# Patient Record
Sex: Male | Born: 1935 | Race: White | Hispanic: No | Smoking: Former smoker
Health system: Southern US, Community
[De-identification: ages and names within clinical notes are randomized; demographics above are authoritative.]

## PROBLEM LIST (undated history)

## (undated) DIAGNOSIS — Z86711 Personal history of pulmonary embolism: Secondary | ICD-10-CM

## (undated) DIAGNOSIS — I10 Essential (primary) hypertension: Secondary | ICD-10-CM

## (undated) DIAGNOSIS — I82419 Acute embolism and thrombosis of unspecified femoral vein: Secondary | ICD-10-CM

## (undated) DIAGNOSIS — E119 Type 2 diabetes mellitus without complications: Secondary | ICD-10-CM

## (undated) DIAGNOSIS — Z85820 Personal history of malignant melanoma of skin: Secondary | ICD-10-CM

## (undated) DIAGNOSIS — I219 Acute myocardial infarction, unspecified: Secondary | ICD-10-CM

## (undated) DIAGNOSIS — I509 Heart failure, unspecified: Secondary | ICD-10-CM

## (undated) DIAGNOSIS — E039 Hypothyroidism, unspecified: Secondary | ICD-10-CM

## (undated) DIAGNOSIS — I429 Cardiomyopathy, unspecified: Secondary | ICD-10-CM

## (undated) DIAGNOSIS — I879 Disorder of vein, unspecified: Secondary | ICD-10-CM

## (undated) DIAGNOSIS — I2510 Atherosclerotic heart disease of native coronary artery without angina pectoris: Secondary | ICD-10-CM

## (undated) DIAGNOSIS — G935 Compression of brain: Secondary | ICD-10-CM

## (undated) DIAGNOSIS — K219 Gastro-esophageal reflux disease without esophagitis: Secondary | ICD-10-CM

## (undated) DIAGNOSIS — E785 Hyperlipidemia, unspecified: Secondary | ICD-10-CM

## (undated) DIAGNOSIS — I519 Heart disease, unspecified: Secondary | ICD-10-CM

## (undated) DIAGNOSIS — L719 Rosacea, unspecified: Secondary | ICD-10-CM

## (undated) DIAGNOSIS — C801 Malignant (primary) neoplasm, unspecified: Secondary | ICD-10-CM

## (undated) DIAGNOSIS — L219 Seborrheic dermatitis, unspecified: Secondary | ICD-10-CM

## (undated) HISTORY — DX: Personal history of pulmonary embolism: Z86.711

## (undated) HISTORY — PX: APPENDECTOMY: SHX54

## (undated) HISTORY — DX: Atherosclerotic heart disease of native coronary artery without angina pectoris: I25.10

## (undated) HISTORY — DX: Compression of brain: G93.5

## (undated) HISTORY — PX: DIAPHRAGMATIC HERNIA REPAIR: SUR1167

## (undated) HISTORY — DX: Acute myocardial infarction, unspecified: I21.9

## (undated) HISTORY — DX: Disorder of vein, unspecified: I87.9

## (undated) HISTORY — DX: Seborrheic dermatitis, unspecified: L21.9

## (undated) HISTORY — PX: ANGIOPLASTY / STENTING FEMORAL: SUR30

## (undated) HISTORY — PX: HERNIA REPAIR: SHX51

## (undated) HISTORY — DX: Hyperlipidemia, unspecified: E78.5

## (undated) HISTORY — DX: Acute embolism and thrombosis of unspecified femoral vein: I82.419

## (undated) HISTORY — PX: CATARACT EXTRACTION: SUR2

## (undated) HISTORY — DX: Heart disease, unspecified: I51.9

## (undated) HISTORY — DX: Essential (primary) hypertension: I10

## (undated) HISTORY — DX: Rosacea, unspecified: L71.9

## (undated) HISTORY — DX: Personal history of malignant melanoma of skin: Z85.820

## (undated) HISTORY — DX: Cardiomyopathy, unspecified: I42.9

## (undated) HISTORY — PX: OTHER SURGICAL HISTORY: SHX169

## (undated) HISTORY — DX: Type 2 diabetes mellitus without complications: E11.9

## (undated) HISTORY — PX: SKIN BIOPSY: SHX1

---

## 1986-04-04 DIAGNOSIS — C801 Malignant (primary) neoplasm, unspecified: Secondary | ICD-10-CM

## 1986-04-04 HISTORY — DX: Malignant (primary) neoplasm, unspecified: C80.1

## 2003-06-22 ENCOUNTER — Other Ambulatory Visit: Payer: Self-pay

## 2004-01-16 ENCOUNTER — Ambulatory Visit: Payer: Self-pay | Admitting: Internal Medicine

## 2005-04-04 DIAGNOSIS — I219 Acute myocardial infarction, unspecified: Secondary | ICD-10-CM

## 2005-04-04 HISTORY — PX: CORONARY ANGIOPLASTY: SHX604

## 2005-04-04 HISTORY — PX: CARDIAC SURGERY: SHX584

## 2005-04-04 HISTORY — DX: Acute myocardial infarction, unspecified: I21.9

## 2005-04-04 HISTORY — PX: CARDIAC CATHETERIZATION: SHX172

## 2005-08-25 ENCOUNTER — Other Ambulatory Visit: Payer: Self-pay

## 2005-08-25 ENCOUNTER — Inpatient Hospital Stay: Payer: Self-pay | Admitting: Internal Medicine

## 2005-11-03 ENCOUNTER — Ambulatory Visit: Payer: Self-pay | Admitting: Gastroenterology

## 2007-06-03 ENCOUNTER — Other Ambulatory Visit: Payer: Self-pay

## 2007-06-03 ENCOUNTER — Emergency Department: Payer: Self-pay | Admitting: Emergency Medicine

## 2007-06-22 ENCOUNTER — Ambulatory Visit: Payer: Self-pay | Admitting: Internal Medicine

## 2007-07-04 ENCOUNTER — Ambulatory Visit: Payer: Self-pay | Admitting: Internal Medicine

## 2007-08-09 ENCOUNTER — Ambulatory Visit: Payer: Self-pay | Admitting: Internal Medicine

## 2007-09-03 ENCOUNTER — Ambulatory Visit: Payer: Self-pay | Admitting: Internal Medicine

## 2008-04-16 ENCOUNTER — Ambulatory Visit: Payer: Self-pay | Admitting: Sports Medicine

## 2008-06-23 ENCOUNTER — Ambulatory Visit: Payer: Self-pay | Admitting: Sports Medicine

## 2009-03-16 ENCOUNTER — Ambulatory Visit: Payer: Self-pay | Admitting: Gastroenterology

## 2009-06-02 HISTORY — PX: ORIF GREATER TROCHANTERIC FRACTURE: SUR934

## 2009-06-23 ENCOUNTER — Inpatient Hospital Stay: Payer: Self-pay | Admitting: Specialist

## 2009-06-30 ENCOUNTER — Encounter: Payer: Self-pay | Admitting: Internal Medicine

## 2009-07-03 ENCOUNTER — Encounter: Payer: Self-pay | Admitting: Internal Medicine

## 2009-08-02 ENCOUNTER — Encounter: Payer: Self-pay | Admitting: Internal Medicine

## 2009-09-02 ENCOUNTER — Encounter: Payer: Self-pay | Admitting: Internal Medicine

## 2009-10-19 ENCOUNTER — Encounter: Payer: Self-pay | Admitting: Specialist

## 2009-10-27 ENCOUNTER — Encounter: Payer: Self-pay | Admitting: Internal Medicine

## 2009-11-02 ENCOUNTER — Encounter: Payer: Self-pay | Admitting: Specialist

## 2009-11-19 ENCOUNTER — Encounter: Payer: Self-pay | Admitting: Internal Medicine

## 2009-12-03 ENCOUNTER — Encounter: Payer: Self-pay | Admitting: Specialist

## 2009-12-22 ENCOUNTER — Encounter: Payer: Self-pay | Admitting: Internal Medicine

## 2010-01-02 ENCOUNTER — Encounter: Payer: Self-pay | Admitting: Specialist

## 2010-01-02 ENCOUNTER — Encounter: Payer: Self-pay | Admitting: Internal Medicine

## 2010-01-19 ENCOUNTER — Ambulatory Visit: Payer: Self-pay | Admitting: Internal Medicine

## 2010-02-02 ENCOUNTER — Encounter: Payer: Self-pay | Admitting: Specialist

## 2010-02-12 ENCOUNTER — Emergency Department: Payer: Self-pay | Admitting: Emergency Medicine

## 2010-03-11 ENCOUNTER — Ambulatory Visit: Payer: Self-pay

## 2010-06-15 ENCOUNTER — Ambulatory Visit: Payer: Self-pay | Admitting: Internal Medicine

## 2010-06-22 ENCOUNTER — Inpatient Hospital Stay: Payer: Self-pay | Admitting: Internal Medicine

## 2010-12-29 ENCOUNTER — Other Ambulatory Visit: Payer: Self-pay | Admitting: Internal Medicine

## 2010-12-30 ENCOUNTER — Ambulatory Visit: Payer: Self-pay | Admitting: Specialist

## 2011-07-07 ENCOUNTER — Ambulatory Visit: Payer: Self-pay | Admitting: Specialist

## 2012-03-13 ENCOUNTER — Ambulatory Visit: Payer: Self-pay | Admitting: Specialist

## 2013-09-25 ENCOUNTER — Ambulatory Visit: Payer: Self-pay | Admitting: Internal Medicine

## 2013-10-02 ENCOUNTER — Ambulatory Visit
Admit: 2013-10-02 | Disposition: A | Discharge status: Home / Self Care | Payer: Self-pay | Attending: Nurse Practitioner | Admitting: Nurse Practitioner

## 2013-10-02 ENCOUNTER — Ambulatory Visit: Payer: Self-pay | Admitting: Internal Medicine

## 2013-10-07 ENCOUNTER — Observation Stay: Payer: Self-pay | Admitting: Surgery

## 2013-10-07 LAB — COMPREHENSIVE METABOLIC PANEL
ALBUMIN: 3.3 g/dL — AB (ref 3.4–5.0)
ALK PHOS: 38 U/L — AB
Anion Gap: 5 — ABNORMAL LOW (ref 7–16)
BUN: 12 mg/dL (ref 7–18)
Bilirubin,Total: 1 mg/dL (ref 0.2–1.0)
CALCIUM: 8.5 mg/dL (ref 8.5–10.1)
Chloride: 101 mmol/L (ref 98–107)
Co2: 28 mmol/L (ref 21–32)
Creatinine: 0.82 mg/dL (ref 0.60–1.30)
EGFR (African American): >60
EGFR (Non-African Amer.): >60
GLUCOSE: 165 mg/dL — AB (ref 65–99)
Osmolality: 272 (ref 275–301)
Potassium: 4.5 mmol/L (ref 3.5–5.1)
SGOT(AST): 25 U/L (ref 15–37)
SGPT (ALT): 26 U/L (ref 12–78)
SODIUM: 134 mmol/L — AB (ref 136–145)
Total Protein: 7.4 g/dL (ref 6.4–8.2)

## 2013-10-07 LAB — CBC WITH DIFFERENTIAL/PLATELET
BASOS PCT: 0.4 %
Basophil #: 0 10*3/uL (ref 0.0–0.1)
Eosinophil #: 0.1 10*3/uL (ref 0.0–0.7)
Eosinophil %: 1.1 %
HCT: 46.1 % (ref 40.0–52.0)
HGB: 15.5 g/dL (ref 13.0–18.0)
Lymphocyte #: 1.2 10*3/uL (ref 1.0–3.6)
Lymphocyte %: 14.7 %
MCH: 34 pg (ref 26.0–34.0)
MCHC: 33.6 g/dL (ref 32.0–36.0)
MCV: 101 fL — AB (ref 80–100)
MONO ABS: 0.5 x10 3/mm (ref 0.2–1.0)
MONOS PCT: 6.5 %
NEUTROS PCT: 77.3 %
Neutrophil #: 6.5 10*3/uL (ref 1.4–6.5)
Platelet: 201 10*3/uL (ref 150–440)
RBC: 4.57 10*6/uL (ref 4.40–5.90)
RDW: 13.4 % (ref 11.5–14.5)
WBC: 8.4 10*3/uL (ref 3.8–10.6)

## 2013-10-07 LAB — LIPASE, BLOOD: Lipase: 95 U/L (ref 73–393)

## 2013-10-08 ENCOUNTER — Encounter: Payer: Self-pay | Admitting: Internal Medicine

## 2013-10-09 LAB — URINALYSIS, COMPLETE
Bacteria: NONE SEEN
Bilirubin,UR: NEGATIVE
Blood: NEGATIVE
Glucose,UR: 50 mg/dL (ref 0–75)
Leukocyte Esterase: NEGATIVE
NITRITE: NEGATIVE
Ph: 6 (ref 4.5–8.0)
Protein: 30
RBC,UR: <1 /HPF (ref 0–5)
Specific Gravity: 1.02 (ref 1.003–1.030)

## 2013-10-10 LAB — URINE CULTURE

## 2013-10-10 LAB — PATHOLOGY REPORT

## 2013-10-17 LAB — COMPREHENSIVE METABOLIC PANEL
ALBUMIN: 2.4 g/dL — AB (ref 3.4–5.0)
Alkaline Phosphatase: 60 U/L
Anion Gap: 7 (ref 7–16)
BUN: 12 mg/dL (ref 7–18)
Bilirubin,Total: 0.8 mg/dL (ref 0.2–1.0)
CALCIUM: 8 mg/dL — AB (ref 8.5–10.1)
CREATININE: 0.9 mg/dL (ref 0.60–1.30)
Chloride: 99 mmol/L (ref 98–107)
Co2: 25 mmol/L (ref 21–32)
EGFR (African American): >60
EGFR (Non-African Amer.): >60
Glucose: 165 mg/dL — ABNORMAL HIGH (ref 65–99)
Osmolality: 266 (ref 275–301)
POTASSIUM: 3.5 mmol/L (ref 3.5–5.1)
SGOT(AST): 26 U/L (ref 15–37)
SGPT (ALT): 39 U/L (ref 12–78)
SODIUM: 131 mmol/L — AB (ref 136–145)
Total Protein: 6.6 g/dL (ref 6.4–8.2)

## 2013-10-17 LAB — CBC WITH DIFFERENTIAL/PLATELET
BASOS PCT: 0.8 %
Basophil #: 0 10*3/uL (ref 0.0–0.1)
EOS ABS: 0.4 10*3/uL (ref 0.0–0.7)
Eosinophil %: 6.3 %
HCT: 40.2 % (ref 40.0–52.0)
HGB: 13.2 g/dL (ref 13.0–18.0)
Lymphocyte #: 1.2 10*3/uL (ref 1.0–3.6)
Lymphocyte %: 19 %
MCH: 33 pg (ref 26.0–34.0)
MCHC: 32.9 g/dL (ref 32.0–36.0)
MCV: 100 fL (ref 80–100)
MONOS PCT: 11 %
Monocyte #: 0.7 x10 3/mm (ref 0.2–1.0)
Neutrophil #: 4.1 10*3/uL (ref 1.4–6.5)
Neutrophil %: 62.9 %
Platelet: 226 10*3/uL (ref 150–440)
RBC: 4.02 10*6/uL — AB (ref 4.40–5.90)
RDW: 12.5 % (ref 11.5–14.5)
WBC: 6.5 10*3/uL (ref 3.8–10.6)

## 2013-10-17 LAB — MAGNESIUM: Magnesium: 1.8 mg/dL

## 2013-10-17 LAB — TSH: Thyroid Stimulating Horm: 0.043 u[IU]/mL — ABNORMAL LOW

## 2013-11-02 ENCOUNTER — Encounter: Payer: Self-pay | Admitting: Internal Medicine

## 2013-11-28 LAB — TSH: THYROID STIMULATING HORM: 5.34 u[IU]/mL — AB

## 2013-12-03 ENCOUNTER — Encounter: Payer: Self-pay | Admitting: Internal Medicine

## 2014-01-02 ENCOUNTER — Encounter: Payer: Self-pay | Admitting: Internal Medicine

## 2014-01-28 LAB — HEMOGLOBIN A1C: HEMOGLOBIN A1C: 8.2 % — AB (ref 4.2–6.3)

## 2014-01-28 LAB — BASIC METABOLIC PANEL
ANION GAP: 10 (ref 7–16)
BUN: 12 mg/dL (ref 7–18)
Calcium, Total: 8.6 mg/dL (ref 8.5–10.1)
Chloride: 103 mmol/L (ref 98–107)
Co2: 27 mmol/L (ref 21–32)
Creatinine: 0.77 mg/dL (ref 0.60–1.30)
GLUCOSE: 195 mg/dL — AB (ref 65–99)
OSMOLALITY: 285 (ref 275–301)
POTASSIUM: 3.8 mmol/L (ref 3.5–5.1)
SODIUM: 140 mmol/L (ref 136–145)

## 2014-01-28 LAB — TSH: THYROID STIMULATING HORM: 29.8 u[IU]/mL — AB

## 2014-02-02 ENCOUNTER — Encounter: Payer: Self-pay | Admitting: Internal Medicine

## 2014-03-02 ENCOUNTER — Inpatient Hospital Stay: Payer: Self-pay | Admitting: Internal Medicine

## 2014-03-02 LAB — BASIC METABOLIC PANEL
ANION GAP: 7 (ref 7–16)
BUN: 13 mg/dL (ref 7–18)
Calcium, Total: 8.3 mg/dL — ABNORMAL LOW (ref 8.5–10.1)
Chloride: 104 mmol/L (ref 98–107)
Co2: 26 mmol/L (ref 21–32)
Creatinine: 0.93 mg/dL (ref 0.60–1.30)
EGFR (African American): >60
Glucose: 265 mg/dL — ABNORMAL HIGH (ref 65–99)
Osmolality: 283 (ref 275–301)
Potassium: 3.8 mmol/L (ref 3.5–5.1)
Sodium: 137 mmol/L (ref 136–145)

## 2014-03-02 LAB — CBC
HCT: 41.8 % (ref 40.0–52.0)
HGB: 13.8 g/dL (ref 13.0–18.0)
MCH: 32.9 pg (ref 26.0–34.0)
MCHC: 33.1 g/dL (ref 32.0–36.0)
MCV: 99 fL (ref 80–100)
Platelet: 197 10*3/uL (ref 150–440)
RBC: 4.21 10*6/uL — ABNORMAL LOW (ref 4.40–5.90)
RDW: 13.4 % (ref 11.5–14.5)
WBC: 8.5 10*3/uL (ref 3.8–10.6)

## 2014-03-02 LAB — TROPONIN I
TROPONIN-I: 0.04 ng/mL
Troponin-I: 0.03 ng/mL

## 2014-03-02 LAB — PROTIME-INR
INR: 1.1
PROTHROMBIN TIME: 13.6 s (ref 11.5–14.7)

## 2014-03-02 LAB — PRO B NATRIURETIC PEPTIDE: B-Type Natriuretic Peptide: 3223 pg/mL — ABNORMAL HIGH (ref 0–450)

## 2014-03-03 LAB — BASIC METABOLIC PANEL
Anion Gap: 8 (ref 7–16)
BUN: 15 mg/dL (ref 7–18)
CALCIUM: 8.1 mg/dL — AB (ref 8.5–10.1)
CREATININE: 0.9 mg/dL (ref 0.60–1.30)
Chloride: 103 mmol/L (ref 98–107)
Co2: 27 mmol/L (ref 21–32)
EGFR (Non-African Amer.): >60
Glucose: 183 mg/dL — ABNORMAL HIGH (ref 65–99)
Osmolality: 281 (ref 275–301)
Potassium: 3.5 mmol/L (ref 3.5–5.1)
Sodium: 138 mmol/L (ref 136–145)

## 2014-03-04 ENCOUNTER — Ambulatory Visit
Admit: 2014-03-04 | Disposition: A | Discharge status: Home / Self Care | Payer: Self-pay | Attending: Nurse Practitioner | Admitting: Nurse Practitioner

## 2014-03-04 ENCOUNTER — Encounter: Payer: Self-pay | Admitting: Internal Medicine

## 2014-03-04 LAB — CBC WITH DIFFERENTIAL/PLATELET
Basophil #: 0 10*3/uL (ref 0.0–0.1)
Basophil %: 0.9 %
EOS PCT: 5.2 %
Eosinophil #: 0.3 10*3/uL (ref 0.0–0.7)
HCT: 39.4 % — ABNORMAL LOW (ref 40.0–52.0)
HGB: 13.1 g/dL (ref 13.0–18.0)
Lymphocyte #: 1.5 10*3/uL (ref 1.0–3.6)
Lymphocyte %: 27.9 %
MCH: 32.7 pg (ref 26.0–34.0)
MCHC: 33.3 g/dL (ref 32.0–36.0)
MCV: 98 fL (ref 80–100)
Monocyte #: 0.7 x10 3/mm (ref 0.2–1.0)
Monocyte %: 13.1 %
NEUTROS ABS: 2.8 10*3/uL (ref 1.4–6.5)
Neutrophil %: 52.9 %
Platelet: 191 10*3/uL (ref 150–440)
RBC: 4.01 10*6/uL — ABNORMAL LOW (ref 4.40–5.90)
RDW: 13.5 % (ref 11.5–14.5)
WBC: 5.4 10*3/uL (ref 3.8–10.6)

## 2014-03-04 LAB — BASIC METABOLIC PANEL
Anion Gap: 8 (ref 7–16)
BUN: 16 mg/dL (ref 7–18)
CO2: 26 mmol/L (ref 21–32)
Calcium, Total: 8.3 mg/dL — ABNORMAL LOW (ref 8.5–10.1)
Chloride: 104 mmol/L (ref 98–107)
Creatinine: 0.85 mg/dL (ref 0.60–1.30)
EGFR (African American): >60
GLUCOSE: 184 mg/dL — AB (ref 65–99)
OSMOLALITY: 282 (ref 275–301)
Potassium: 3.7 mmol/L (ref 3.5–5.1)
Sodium: 138 mmol/L (ref 136–145)

## 2014-04-04 ENCOUNTER — Encounter: Payer: Self-pay | Admitting: Internal Medicine

## 2014-05-05 ENCOUNTER — Encounter: Payer: Self-pay | Admitting: Internal Medicine

## 2014-06-03 ENCOUNTER — Encounter
Admit: 2014-06-03 | Disposition: A | Discharge status: Home / Self Care | Payer: Self-pay | Attending: Internal Medicine | Admitting: Internal Medicine

## 2014-07-04 ENCOUNTER — Encounter
Admit: 2014-07-04 | Disposition: A | Discharge status: Home / Self Care | Payer: Self-pay | Attending: Internal Medicine | Admitting: Internal Medicine

## 2014-07-26 NOTE — Op Note (Signed)
PATIENT NAME:  Ruben Gallegos, Ruben Gallegos MR#:  [id number] DATE OF BIRTH:  01-Dec-1920  DATE OF PROCEDURE:  10/07/2013  OPERATION PERFORMED: Repair of incarcerated right inguinal hernia with mesh Karl Pock).   PREOPERATIVE DIAGNOSIS: Incarcerated right inguinal hernia.   POSTOPERATIVE DIAGNOSIS: Incarcerated right inguinal hernia (direct).   FINDINGS: Venous congestion of small bowel, all viable.   PROCEDURE IN DETAIL: The patient was placed supine on the Operating Room table and prepped and draped in the usual sterile fashion. An incision was made in the lines of the skin overlying the right inguinal canal, and this was carried down through the subcutaneous tissue and Scarpa's fascia with the electrocautery. The external oblique aponeurosis was opened in the direction of its fibers to the external inguinal ring, and the surrounding tissue around the hernia sac was dissected off of the top of the hernia sac and then, ultimately, the hernia sac was entered but not real high in the inguinal canal.   There were multiple loops of small intestine, with some localized ascites present. The loops of small bowel demonstrated venous congestion, and there was one area where the small intestine was coming through the ring and looked initially to be a little bit narrowed, but with further manipulation, it was quite viable, with no evidence of stricture formation. The loops of small intestine were dunked back within the peritoneal cavity, and the remainder of the very large direct hernia sac was dissected free of surrounding structures to an area very high on the inguinal floor. Here, it was closed with a pursestring suture of 3-0 Monocryl and the excess hernia sac was amputated.   This left the inguinal floor essentially flat. There was a large weakness in the inguinal floor, but the actual defect was quite small, being approximately 15 mm in diameter. The spermatic cord was plastered to the lateral wall of the inguinal  canal, and it was dissected off of the lateral edge of the external oblique aponeurosis and Poupart's ligament, such that it was completely freed up along the entire inguinal canal and it was also dissected off of the pubic tubercle inferiorly. Dissection underneath the external oblique aponeurosis was performed superolaterally to the cord.   A large polypropylene precut keyhole mesh by Davol was used to perform a Lichtenstein, tension-free type repair with two running suture lines. The lateral suture line began at the pubic tubercle and went along the shelving edge of Poupart's ligament, up to the level of the new deep ring (keyhole), and the medial suture line went from the pubic tubercle across the conjoint tendon to the internal oblique musculature, and was carried up to the new deep ring as well.   The tails of the mesh lay flat and crossed superolateral to the new deep ring and underneath the external oblique aponeurosis, and the toe of the mesh overlapped the pubic tubercle itself. The spermatic cord was replaced in its anatomic position, and the external oblique aponeurosis was closed, reconstructing the external inguinal ring in a fashion that was more inferior than it had been, due to the large hernia sac, and this was done with a running suture line of 2-0 Prolene as well. Interrupted 3-0 Monocryls were placed in Scarpa's fascia, and the skin was reapproximated with an Insorb stapling device and a 1 inch suture strip. The patient tolerated the procedure well. There were no complications.     ____________________________ Consuela Mimes, MD wfm:cg D: 10/07/2013 22:10:40 ET T: 10/08/2013 06:25:25 ET JOB#: [id number]  cc: Gwyndolyn Saxon  Margy Clarks, MD, <Dictator> [name], MD Consuela Mimes MD ELECTRONICALLY SIGNED 10/09/2013 19:37

## 2014-07-26 NOTE — Consult Note (Signed)
PATIENT NAME:  Ruben Gallegos, Ruben Gallegos MR#:  [id number] DATE OF BIRTH:  [birth year]  DATE OF CONSULTATION:  03/02/2014  CONSULTING PHYSICIAN:  Isaias Cowman, MD  PRIMARY CARE PHYSICIAN: [name], MD   CARDIOLOGIST: Corey Skains, MD   CHIEF COMPLAINT: Shortness of breath.   REASON FOR CONSULTATION: Consultation requested for evaluation of congestive heart failure.   HISTORY OF PRESENT ILLNESS: The patient is a 81 year old gentleman with a prior history of DVT, coronary artery disease status post coronary stent and chronic congestive heart failure. The patient apparently had been in his usual state of health until approximately 2 months ago following a hernia repair, started to have increased appetite but also notable worsening peripheral edema and shortness of breath. He presented to [facility] Emergency Room yesterday with shortness of breath. Chest x-ray revealed pulmonary congestion and the patient was treated with furosemide 40 mg IV with diuresis and overall clinical improvement. Lower extremity ultrasound was performed, which revealed right lower extremity DVT, which was occlusive. A CT scan was performed, which revealed evidence for small pulmonary emboli. Admission labs were notable for a negative troponin of 0.03.   PAST MEDICAL HISTORY: 1. Status post coronary stent in 2006.  2. Congestive heart failure.  3. History of left lower extremity DVT following hip surgery in 2001.  4. Hyperlipidemia.  5. Hypothyroidism.  6. History of melanoma.   MEDICATIONS: Enteric aspirin 325 mg daily, carvedilol 3.125 mg b.i.d., furosemide 40 mg p.o. daily, Lescol 40 mg daily, potassium 10 mEq daily, cholecalciferol 1000 international units 1 capsule daily, glipizide 10 mg b.i.d., Lantus 14 units daily, mirtazapine 7.5 mg 3 tabs at bedtime, Nexium 40 mg daily, polyethylene glycol p.r.n., Synthroid 100 mcg daily.   SOCIAL HISTORY: The patient currently lives alone at [facility] in independent  living. He has a remote tobacco abuse history.   FAMILY HISTORY: Positive for coronary artery disease.   REVIEW OF SYSTEMS:  CONSTITUTIONAL: No fever or chills.   EYES: No blurry vision.   EARS: No hearing loss.   RESPIRATORY: Shortness of breath as described above.   CARDIOVASCULAR: The patient denies chest pain.   GASTROINTESTINAL: No nausea, vomiting or diarrhea.   GENITOURINARY: No dysuria or hematuria.   ENDOCRINE: No polyuria or polydipsia.   MUSCULOSKELETAL: No arthralgias or myalgias.   NEUROLOGICAL: No focal muscle weakness or numbness.   PSYCHOLOGICAL: No depression or anxiety.   PHYSICAL EXAMINATION: VITAL SIGNS: Blood pressure 115/74, pulse 79, respirations 21, temperature 97.5, pulse oximetry 94%.   HEENT: Pupils equal, reactive to light and accommodation.   NECK: Supple without thyromegaly.   LUNGS: Clear.   HEART: Normal JVP. Normal PMI. Regular rate and rhythm. Normal S1, S2. No appreciable gallop, murmur or rub.   ABDOMEN: Soft and nontender. Pulses were intact bilaterally.   MUSCULOSKELETAL: Normal muscle tone.   NEUROLOGIC: The patient is alert and oriented x 3. Motor and sensory both grossly intact.   IMPRESSION: A 81 year old gentleman who presents primarily with progressive shortness of breath and peripheral edema with negative troponin. Lower extremity ultrasound reveals evidence for a deep vein thrombosis and CT scan shows small pulmonary embolus. The patient has shown clinical improvement after initial diuresis with intravenous furosemide.   RECOMMENDATIONS: 1. Continue diuresis.  2. Resume carvedilol 3.125 mg b.i.d.  3. Review 2-D echocardiogram.  4. The patient will likely require chronic anticoagulation for deep vein thrombosis and pulmonary emboli.    ____________________________ Isaias Cowman, MD ap:TT D: 03/02/2014 09:30:11 ET T: 03/02/2014  14:31:42 ET JOB#: [id number]  cc: Isaias Cowman, MD, <Dictator> [name], MD Isaias Cowman MD ELECTRONICALLY SIGNED 04/01/2014 13:07

## 2014-07-26 NOTE — Discharge Summary (Signed)
PATIENT NAME:  Ruben Gallegos, Ruben Gallegos MR#:  [id number] DATE OF BIRTH:  Jun 27, 1920  DATE OF ADMISSION:  03/02/2014 DATE OF DISCHARGE:    INAL DIAGNOSES.  1.  Acute pulmonary embolism.  2.  Right leg deep vein thrombosis.  3.  Chronic left-sided systolic congestive heart failure.  4.  Coronary artery disease.  5.  Diabetes mellitus, type 2, uncontrolled.  6.  Acute respiratory failure secondary to pulmonary embolism.  7.  Osteoarthritis.  8.  History of melanoma.  9.  Remote history of left leg deep vein thrombosis after hip surgery 2011.  10.  Hypothyroidism.   HISTORY AND PHYSICAL: Please see dictated admission history and physical.   [place]: The patient was admitted with acute respiratory failure. He was found to have right leg DVT, although he had swelling in his left leg. Subsequently, he underwent CT scan, which confirmed pulmonary embolism. He was initially placed on Lovenox, and he tolerated this well without bleeding. His respiratory status improved rapidly as did the chest tightness that he described.  His swelling minimal. He was continued on beta blockers for his congestive heart failure; however, his blood pressure was low enough that he would not tolerate ACE inhibitors, ARB or Aldactone. Lasix was administered, which he tolerated well. He was changed over to Xarelto 15 mg b.i.d. and taken off the Lovenox. He tolerated this well without evidence of bleeding of any significance. He is ambulating and feels much improved. His oxygen saturation is over 90% on room air and he feels ready for discharge. At this point, he will be discharged to a skilled nursing facility to improve his strength. His physical activity should be up with a walker with assistance as tolerated. Physical therapy and occupational therapy to evaluate and treat the patient. He did follow a no-added salt, no concentrated sweets diet. His blood sugars were checked 3 times a day before meals. He will follow up  with the nursing home physician. It was recommended that MET-B and CBC be performed within the next one week, however, this was at the discretion of the nursing home physician. He should be weighed daily with physician called for more than 2 pounds gain in 1 day or 5 pounds in 1 week, or increasing symptoms of heart failure.   DISCHARGE MEDICATIONS: 1.  Vitamin D 1000 units p.o. daily.  2.  Synthroid 0.1 mg p.o. daily.  3.  Potassium 10 mEq p.o. daily as needed with Lasix.  4.  Mirtazapine 7.5 mg 3 tablets p.o. at bedtime for sleep and for appetite stimulation.  5.  Lantus 14 units subcutaneous 2 daily.  6.  Lescol XL 40 mg p.o. daily.  7. Lasix 40 mg p.o. daily, which may be changed as needed if he starts showing signs of dehydration.  8.  Nexium 40 mg p.o. daily.  9.  MiraLax 17 grams p.o. daily as needed for constipation.  10.  Carvedilol 3.125 mg p.o. b.i.d.  11.  Xarelto 15 mg p.o. b.i.d. x 20 days to complete 21 day course., then 20 mg daily. Given that this is the patient's second blood clot, recommendation is for lifelong anticoagulation unless he has evidence of bleeding or other contraindication.  12.  Sliding scale insulin.   He was instructed to hold glipizide at this time. He will hold aspirin for now as Xarelto has been started.   CODE STATUS: The patient is do not resuscitate and has facility DNR form.   TIME OF DISCHARGE:  35  minutes    ____________________________ [name], MD bjk:DT D: 03/04/2014 14:03:49 ET T: 03/04/2014 14:13:10 ET JOB#: [id number]  cc: Tama High III, MD, <Dictator> Ramonita Lab MD ELECTRONICALLY SIGNED 03/11/2014 18:34

## 2014-07-26 NOTE — H&P (Signed)
PATIENT NAME:  Ruben Gallegos, Ruben Gallegos MR#:  [id number] DATE OF BIRTH:  [birth year]  DATE OF ADMISSION:  03/02/2014  EMERGENCY ROOM DOCTOR: Gretchen Short. Beather Arbour, MD  PRIMARY CARE DOCTOR: [name], MD  ADMITTING PHYSICIAN: Juluis Mire, M.D.   CHIEF COMPLAINT: 1.  Acute shortness of breath, which she developed at independent living assisted home. 2.  Found hypoxic on room air with 83% O2 saturation.   HISTORY OF PRESENT ILLNESS: A 81 year old Caucasian male, a resident of independent living home developed acute shortness of breath while at home last night. Hence, EMS was called and brought to the Emergency Room for further evaluation. On arrival in the Emergency Room, the patient was also noted to have acute shortness of breath with hypoxia of 83% on room air. The patient did not complain of any chest pain, no dizziness. No loss of consciousness. No fever, cough. No focal weakness or numbness. No nausea, vomiting, diarrhea. No aspirations. No urinary symptoms. The patient stated that these shortness of breath symptoms are similar to when he had his heart attack few years ago in the past. In the Emergency Room, the patient was evaluated by the ED physician and was found to have a chest x-ray with pulmonary congestion ,was given IV Lasix 40 mg for the pulmonary congestion. Further work-up in the Emergency Room revealed a normal troponin, but in view of his history of left lower extremity DVT in the past, he has undergone DVT studies and the results of which are left lower extremity, negative for any acute DVT, but he is diagnosed to have acute right lower extremity DVT on the femoral vein, which is nonocclusive. The patient, again, denies any chest pain and he is getting oxygen supplementation for sob and states that he is feeling slightly better. Of note, the patient recently underwent hernia surgery in July 2015, following which he was admitted to skilled nursing facility because of weight loss, because of  poor oral intake and up until recently, a week ago, since he was doing better, he was discharged back to his independent living home. The patient is a DO NOT RESUSCITATE status, which is confirmed by his son, who is at the patient's bedside at this time.   PAST MEDICAL HISTORY: 1.  Coronary artery disease/MI status post angioplasty.  2.  Hyperlipidemia.  3.  Hypothyroidism.  4.  Diabetes mellitus type 2 on insulin.  5.  History of DVT left lower extremity following left hip surgery in 2011.  6.  Seborrheic dermatitis.  7.  Rosacea.  8.  Melanoma.   PAST SURGICAL HISTORY: Status post appendectomy, right hip fracture repair, left hip arthroplasty in 2011, cataract surgery status post cardiac cath and angioplasty and recent left inguinal hernia repair in July 2015.   HOME MEDICATIONS: 1.  Enteric-coated aspirin 325 mg 1 tablet once a day.  2.  Carvedilol 3.125 mg 1 tablet orally 2 times a day.  3.  Cholecalciferol 1000 international units orally 1 capsule once a day.  4.  Glipizide 10 mg tablet 1 tablet orally 2 times a day.  5.  Lantus 14 units subcutaneously once a day.  6.  Lasix 40 mg 1 tablet orally once a day.  7.  Lescol XL 40 mg tablet 1 tablet orally once a day.  8.  Mirtazapine 7.5 mg 3 tablets orally at bedtime.  9.  Nexium 40 mg tablet 1 tablet orally once a day.  10.  Polyethylene glycol as needed for constipation.  11.  Potassium 10 mEq orally once a day with Lasix.  12.  Synthroid 100 mcg tablet 1 tablet orally once a day.  13.  Tylenol 500 mg 1 tablet orally once a day as needed.   ALLERGIES: 1.  LIPITOR.  2.  MEVACOR.  3.  DEMEROL.  4.  CODEINE.   SOCIAL HISTORY: He lives at [facility] independent living by himself and has a Marine scientist who checks on him regularly. History of smoking in the past. Quit about more than 30 years ago, and he does take alcohol occasionally. No history of any substance abuse.   FAMILY HISTORY: Father with coronary artery disease and  prostate cancer, mother with diabetes.   REVIEW OF SYSTEMS:  CONSTITUTIONAL: Negative for fever, fatigue, weakness, pain, weight gain, or weight loss.  EYES: Negative for blurred vision or double vision. No pain. No redness. No inflammation.  EARS, NOSE, AND THROAT: Negative for tinnitus, ear pain, hearing loss, epistaxis, nasal discharge, or difficulty swallowing.  RESPIRATORY: Negative for cough, but he developed acute shortness of breath while at independent assisted living home, for which he came to the Emergency Room and was found to be hypoxic on arrival to the Emergency Room.  CARDIOVASCULAR: Negative for chest pain. No pedal edema. No palpitations. No dizziness. No syncope. He does have some acute shortness of shortness of breath as mentioned earlier.  GASTROINTESTINAL: Negative for nausea, vomiting, diarrhea, abdominal pain, hematemesis, melena, or blood in the stool.  GENITOURINARY: Negative for dysuria, hematuria, frequency, or urgency.  ENDOCRINE: Negative for polydipsia. No nocturia. History of hypothyroidism, takes Synthroid. No heat or cold intolerance.  HEMATOLOGIC: Negative for anemia, easy bruising, bleeding. INTEGUMENTARY: Negative for acne, rash, or skin lesions.  MUSCULOSKELETAL: Negative for any arthritis, swelling, or redness of the joints.  NEUROLOGICAL: Negative for focal weakness or numbness. No history of CVA, TIA, or seizure disorder in the past.  PSYCHIATRIC: Negative for anxiety, insomnia, depression.   PHYSICAL EXAMINATION: VITAL SIGNS: On arrival to the Emergency Room, temperature 98.3 degrees Fahrenheit, pulse rate 85 per minute, respirations 24 per minute, blood pressure 145/91, O2 saturation 94%, earlier it was 83% on room air. Current vital signs: Pulse 81 per minute, respirations 20 per minute, blood pressure 118/75, oxygen saturation 95% on oxygen supplementation through nasal cannula.  GENERAL: Well-nourished, well-developed, alert and oriented x3,  comfortable lying in the bed, not in any acute distress.  HEAD: Atraumatic, normocephalic.  EYES: Pupils are equal and reactive to light and accommodation. No conjunctival pallor. No scleral icterus. Extraocular movements intact.  NOSE: No nasal lesions. No drainage.  EARS: No drainage. No external lesions.  ORAL CAVITY: No mucosal lesions. No exudates.  NECK: Supple. No JVD. No thyromegaly, no carotid bruit. Range of motion of neck normal.  RESPIRATORY: Good respiratory effort, bilateral vesicular breath sounds present. Not using accessory muscles of respiration. No rales or rhonchi.  CARDIOVASCULAR: S1, S2 regular. No murmurs, gallops, or clicks appreciated. Peripheral pulses  equal, 1+ peripheral edema bilaterally present.  GASTROINTESTINAL: Abdomen is soft and nontender. No hepatosplenomegaly. No tenderness. No guarding. No rigidity. Bowel sounds present and equal in all 4 quadrants.  GENITOURINARY: Deferred.  MUSCULOSKELETAL: Gait not tested. Range of motion in all joints normal. No swelling or limitation of movements in any joints.  SKIN: Inspection within normal limits.  LYMPHATIC: No cervical lymphadenopathy.  VASCULAR: Good dorsalis pedis and posterior tibial pulses.  NEUROLOGICAL: Alert, awake, and oriented x3. Cranial nerves II through XII grossly intact. DTR 2+ and symmetrical  bilateral in both upper and lower extremities. Motor strength is 5/5 in both upper and lower extremities.  PSYCHIATRIC: Judgment and insight are adequate. Alert and oriented x3. Memory and mood within normal limits.   LABORATORY RESULTS: Blood glucose 265, BNP 3223, BUN 13, creatinine 0.93, serum sodium 137, potassium 3.8, chloride 104, bicarbonate 26, total calcium 8.3, troponin 0.03. WBC 8.5, hemoglobin 13.8, hematocrit 41.8, platelet count 197,000. Prothrombin time 13.6, INR 1.1.   IMAGING STUDIES: Chest x-ray: Mild vascular congestion noted, mild bilateral atelectasis seen.   DEEP VENOUS THROMBOSIS STUDIES  OF BILATERAL LOWER EXTREMITIES:  1.  Nonocclusive thrombus in the right femoral vein, which is incompletely imaged.  2.  No evidence of acute DVT in left LE, mid left lower extremity to the level of the knee. Calf veins not well seen.  3.  Baker's cyst, left popliteal fossa.   EKG: Normal sinus rhythm with ventricular rate of 86 beats, first-degree AV block, left axis deviation, left ventricular hypertrophy with QRS widening.   ASSESSMENT AND PLAN: A 81 year old Caucasian male with a past medical history of multiple medical problems including coronary artery disease/myocardial infarction status post stent, systolic congestive heart failure with ejection fraction of less than 15%, hyperlipidemia, hypothyroidism, diabetes mellitus on insulin, history of left lower extremity deep vein thrombosis in 2011, on DO NOT RESUSCITATE status presents with acute onset of shortness of breath with hypoxia (room air oxygen 83%).   1.  Acute hypoxic respiratory failure secondary due to acute on chronic systolic congestive heart failure, high suspicion of pulmonary embolism in view of acute right lower extremity deep vein thrombosis.   PLAN: Admit to telemetry. Oxygen supplementation, IV Lasix, cycle cardiac enzymes, echocardiogram, cardiology consult. Continue beta blocker, ACE inhibitors, aspirin, statins. Check stat CT angiogram of the chest to evaluate for pulmonary embolism.  2.  Acute right lower extremity deep vein thrombosis with a history of LLE dvt in 2011. Start heparin drip. Obtain ct [place] of chest for evaluation of PE. 3. Acute on chronic systolic CHF with EF < 15 %. IV laxix, BB, ASA, ACEI as tolerated. Cycle CE's, Echo, cards consult. 4.Hx of CAD s/p stent. No cp, troponin x1neg. Cyclel CE's. BB, ASA. Echo, cards consult. 5. DM on insulin, stable, cont same. Monitor BS. 6. HLD on statin, stable, cont same. 7. Hypothyroidsm- on levothyroxine, stable, cont same. DVT prophylaxis- heparin drip for  acute DVT. G.I prophylaxis - PPI.  Code status : DNR. Time spent 55 min  copy to Dr. Silvano Bilis.,PCP Dr. Wallace Cullens      ____________________________ Juluis Mire, MD enr:sw D: 03/02/2014 05:02:36 ET T: 03/02/2014 07:36:12 ET JOB#: [id number]  cc: Juluis Mire, MD, <Dictator> Juluis Mire MD ELECTRONICALLY SIGNED 03/02/2014 20:57

## 2014-07-26 NOTE — H&P (Signed)
   Subjective/Chief Complaint Painful right inguinal hernia, nonreducible   History of Present Illness Ruben Gallegos is a pleasant  81 yo M with a history of CHF, CAD s/p MI with stents 2005 who presents with worsening right groin pain, swelling, and inability to reduce right groin bulge.  Says that he has noted a right groin hernia in the past but it was always able to be reduced.  Over the past week it has gotten increasingly hard, large and painful.  Last BM 2 days ago but was small which he relates to not being able to eat much.  Appetite has been poor x 3-4 weeks with 15 lb weight loss.  Has been worked up for epigastric pain x 3-4 months as well, found to have gallstones and negative hida scan.  Otherwise doing well.  No fevers/chills.  No nausea/vomiting   Past History CHF IDDM CAD s/p MI s/p stent 2005 HTN Hypothyroidism H/o DVT Gallstones H/o ventral hernia repair?   Primary Physician Dr. Caryl Comes   Past Med/Surgical Hx:  Hyperlipidemia:   CAD:   seborrhea:   rosacea:   melonoma:   Hypothyroidism:   Diabetes:   MI: x2  Angioplasty:   Cataract Extraction:   right hip fx:   Appendectomy:   Hernia Repair:   ALLERGIES:  Demerol: Unknown  Codeine: Unknown  Lipitor: Other  Mevacor: Other  Family and Social History:  Family History Non-Contributory   Social History negative tobacco, negative ETOH   Place of Living [employer], lives by self   Review of Systems:  Subjective/Chief Complaint Right inguinal hernia pain, swelling   Fever/Chills No   Cough No   Sputum No   Abdominal Pain No   Diarrhea No   Constipation No   Nausea/Vomiting No   SOB/DOE No   Chest Pain No   Dysuria No   Tolerating PT No   Tolerating Diet Yes  Poor appetite   Physical Exam:  GEN well developed, well nourished, no acute distress, thin   HEENT pink conjunctivae, PERRL, hearing intact to voice   RESP normal resp effort  clear BS  no use of accessory muscles   CARD  regular rate  no murmur  no thrills  No LE edema   ABD denies tenderness  positive hernia  normal BS  no Abdominal Bruits  no Adominal Mass  Large irreducible right inguinal bulge, tight, mild tender   EXTR negative cyanosis/clubbing, negative edema   SKIN normal to palpation, No rashes, skin turgor good   NEURO cranial nerves intact, negative rigidity, negative tremor, follows commands, strength:, motor/sensory function intact   PSYCH A+O to time, place, person, good insight    Assessment/Admission Diagnosis Ruben Gallegos is a pleasant 81 yo M with multiple medical comorbidities who presents with a large, tight, irreducible right inguinal hernia.   Plan To OR for emergent right inguinal hernia repair with nighttime surgeon, Dr. Leanora Cover.  I have explained the risks and benefits associated with surgery, including risk for MI or death or postoperative difficulties, and he agrees to this plan.   Electronic Signatures: Floyde Parkins (MD)  (Signed 06-Jul-15 18:13)  Authored: CHIEF COMPLAINT and HISTORY, PAST MEDICAL/SURGIAL HISTORY, ALLERGIES, FAMILY AND SOCIAL HISTORY, REVIEW OF SYSTEMS, PHYSICAL EXAM, ASSESSMENT AND PLAN   Last Updated: 06-Jul-15 18:13 by Floyde Parkins (MD)

## 2015-03-23 ENCOUNTER — Other Ambulatory Visit: Payer: Self-pay | Admitting: Internal Medicine

## 2015-03-23 ENCOUNTER — Ambulatory Visit
Admission: RE | Admit: 2015-03-23 | Discharge: 2015-03-23 | Disposition: A | Discharge status: Home / Self Care | Payer: Medicare Other | Source: Ambulatory Visit | Attending: Internal Medicine | Admitting: Internal Medicine

## 2015-03-23 DIAGNOSIS — R600 Localized edema: Secondary | ICD-10-CM | POA: Insufficient documentation

## 2015-03-23 DIAGNOSIS — M7122 Synovial cyst of popliteal space [Baker], left knee: Secondary | ICD-10-CM | POA: Insufficient documentation

## 2015-03-23 DIAGNOSIS — Z86711 Personal history of pulmonary embolism: Secondary | ICD-10-CM | POA: Insufficient documentation

## 2015-03-23 DIAGNOSIS — Z86718 Personal history of other venous thrombosis and embolism: Secondary | ICD-10-CM | POA: Insufficient documentation

## 2015-03-23 HISTORY — DX: Personal history of pulmonary embolism: Z86.711

## 2015-05-21 DIAGNOSIS — I471 Supraventricular tachycardia: Secondary | ICD-10-CM | POA: Insufficient documentation

## 2015-08-10 ENCOUNTER — Encounter
Admission: RE | Admit: 2015-08-10 | Discharge: 2015-08-10 | Disposition: A | Discharge status: Home / Self Care | Payer: Medicare Other | Source: Ambulatory Visit | Attending: Internal Medicine | Admitting: Internal Medicine

## 2015-08-10 DIAGNOSIS — I2510 Atherosclerotic heart disease of native coronary artery without angina pectoris: Secondary | ICD-10-CM | POA: Insufficient documentation

## 2015-08-10 DIAGNOSIS — I493 Ventricular premature depolarization: Secondary | ICD-10-CM | POA: Insufficient documentation

## 2015-08-24 ENCOUNTER — Encounter: Payer: Self-pay | Admitting: Radiology

## 2015-08-24 ENCOUNTER — Emergency Department: Payer: Medicare Other

## 2015-08-24 ENCOUNTER — Inpatient Hospital Stay
Admission: EM | Admit: 2015-08-24 | Discharge: 2015-08-27 | DRG: 293 | Disposition: A | Discharge status: Skilled Nursing Facility | Payer: Medicare Other | Attending: Internal Medicine | Admitting: Internal Medicine

## 2015-08-24 DIAGNOSIS — Z888 Allergy status to other drugs, medicaments and biological substances status: Secondary | ICD-10-CM

## 2015-08-24 DIAGNOSIS — Z7901 Long term (current) use of anticoagulants: Secondary | ICD-10-CM

## 2015-08-24 DIAGNOSIS — Z86711 Personal history of pulmonary embolism: Secondary | ICD-10-CM

## 2015-08-24 DIAGNOSIS — I10 Essential (primary) hypertension: Secondary | ICD-10-CM | POA: Diagnosis present

## 2015-08-24 DIAGNOSIS — K219 Gastro-esophageal reflux disease without esophagitis: Secondary | ICD-10-CM | POA: Diagnosis present

## 2015-08-24 DIAGNOSIS — E039 Hypothyroidism, unspecified: Secondary | ICD-10-CM | POA: Diagnosis present

## 2015-08-24 DIAGNOSIS — I5023 Acute on chronic systolic (congestive) heart failure: Secondary | ICD-10-CM | POA: Diagnosis present

## 2015-08-24 DIAGNOSIS — Z66 Do not resuscitate: Secondary | ICD-10-CM | POA: Diagnosis present

## 2015-08-24 DIAGNOSIS — R0602 Shortness of breath: Secondary | ICD-10-CM

## 2015-08-24 DIAGNOSIS — I110 Hypertensive heart disease with heart failure: Secondary | ICD-10-CM | POA: Diagnosis not present

## 2015-08-24 DIAGNOSIS — I959 Hypotension, unspecified: Secondary | ICD-10-CM | POA: Diagnosis not present

## 2015-08-24 DIAGNOSIS — Z87891 Personal history of nicotine dependence: Secondary | ICD-10-CM

## 2015-08-24 DIAGNOSIS — I509 Heart failure, unspecified: Secondary | ICD-10-CM

## 2015-08-24 DIAGNOSIS — E785 Hyperlipidemia, unspecified: Secondary | ICD-10-CM | POA: Diagnosis present

## 2015-08-24 DIAGNOSIS — E119 Type 2 diabetes mellitus without complications: Secondary | ICD-10-CM | POA: Diagnosis present

## 2015-08-24 DIAGNOSIS — R748 Abnormal levels of other serum enzymes: Secondary | ICD-10-CM | POA: Diagnosis present

## 2015-08-24 DIAGNOSIS — Z885 Allergy status to narcotic agent status: Secondary | ICD-10-CM

## 2015-08-24 DIAGNOSIS — Z79899 Other long term (current) drug therapy: Secondary | ICD-10-CM

## 2015-08-24 DIAGNOSIS — Z86718 Personal history of other venous thrombosis and embolism: Secondary | ICD-10-CM

## 2015-08-24 DIAGNOSIS — E876 Hypokalemia: Secondary | ICD-10-CM | POA: Diagnosis present

## 2015-08-24 HISTORY — DX: Gastro-esophageal reflux disease without esophagitis: K21.9

## 2015-08-24 HISTORY — DX: Type 2 diabetes mellitus without complications: E11.9

## 2015-08-24 HISTORY — DX: Hyperlipidemia, unspecified: E78.5

## 2015-08-24 HISTORY — DX: Hypothyroidism, unspecified: E03.9

## 2015-08-24 HISTORY — DX: Heart failure, unspecified: I50.9

## 2015-08-24 HISTORY — DX: Malignant (primary) neoplasm, unspecified: C80.1

## 2015-08-24 HISTORY — DX: Essential (primary) hypertension: I10

## 2015-08-24 LAB — CBC WITH DIFFERENTIAL/PLATELET
BASOS ABS: 0 10*3/uL (ref 0–0.1)
EOS ABS: 0.1 10*3/uL (ref 0–0.7)
HCT: 43.3 % (ref 40.0–52.0)
Hemoglobin: 14.6 g/dL (ref 13.0–18.0)
Lymphocytes Relative: 8 %
Lymphs Abs: 0.9 10*3/uL — ABNORMAL LOW (ref 1.0–3.6)
MCH: 33 pg (ref 26.0–34.0)
MCHC: 33.7 g/dL (ref 32.0–36.0)
MCV: 97.9 fL (ref 80.0–100.0)
MONO ABS: 0.7 10*3/uL (ref 0.2–1.0)
Monocytes Relative: 6 %
NEUTROS ABS: 9.6 10*3/uL — AB (ref 1.4–6.5)
Neutrophils Relative %: 85 %
Platelets: 164 10*3/uL (ref 150–440)
RBC: 4.43 MIL/uL (ref 4.40–5.90)
RDW: 13.5 % (ref 11.5–14.5)
WBC: 11.4 10*3/uL — ABNORMAL HIGH (ref 3.8–10.6)

## 2015-08-24 LAB — TROPONIN I: Troponin I: 0.05 ng/mL — ABNORMAL HIGH (ref <0.031)

## 2015-08-24 LAB — BASIC METABOLIC PANEL
ANION GAP: 8 (ref 5–15)
BUN: 15 mg/dL (ref 6–20)
CALCIUM: 8.6 mg/dL — AB (ref 8.9–10.3)
CO2: 22 mmol/L (ref 22–32)
CREATININE: 0.84 mg/dL (ref 0.61–1.24)
Chloride: 105 mmol/L (ref 101–111)
GFR calc non Af Amer: >60 mL/min (ref >60)
GLUCOSE: 155 mg/dL — AB (ref 65–99)
Potassium: 3.7 mmol/L (ref 3.5–5.1)
SODIUM: 135 mmol/L (ref 135–145)

## 2015-08-24 LAB — BRAIN NATRIURETIC PEPTIDE: B NATRIURETIC PEPTIDE 5: 367 pg/mL — AB (ref 0.0–100.0)

## 2015-08-24 IMAGING — CR DG ABDOMEN 3V
1 series · 5 of 5 positions shown · non-contrast
Comparison: CT thorax 03/13/2012, HIDA scan 10/02/2013

CLINICAL DATA: Coronary disease, right lower quadrant pain

EXAM:
ABDOMEN SERIES

[Series 1: w chest pa · 0.14mm/px · 5 of 5 slices shown]
[im 1/5]
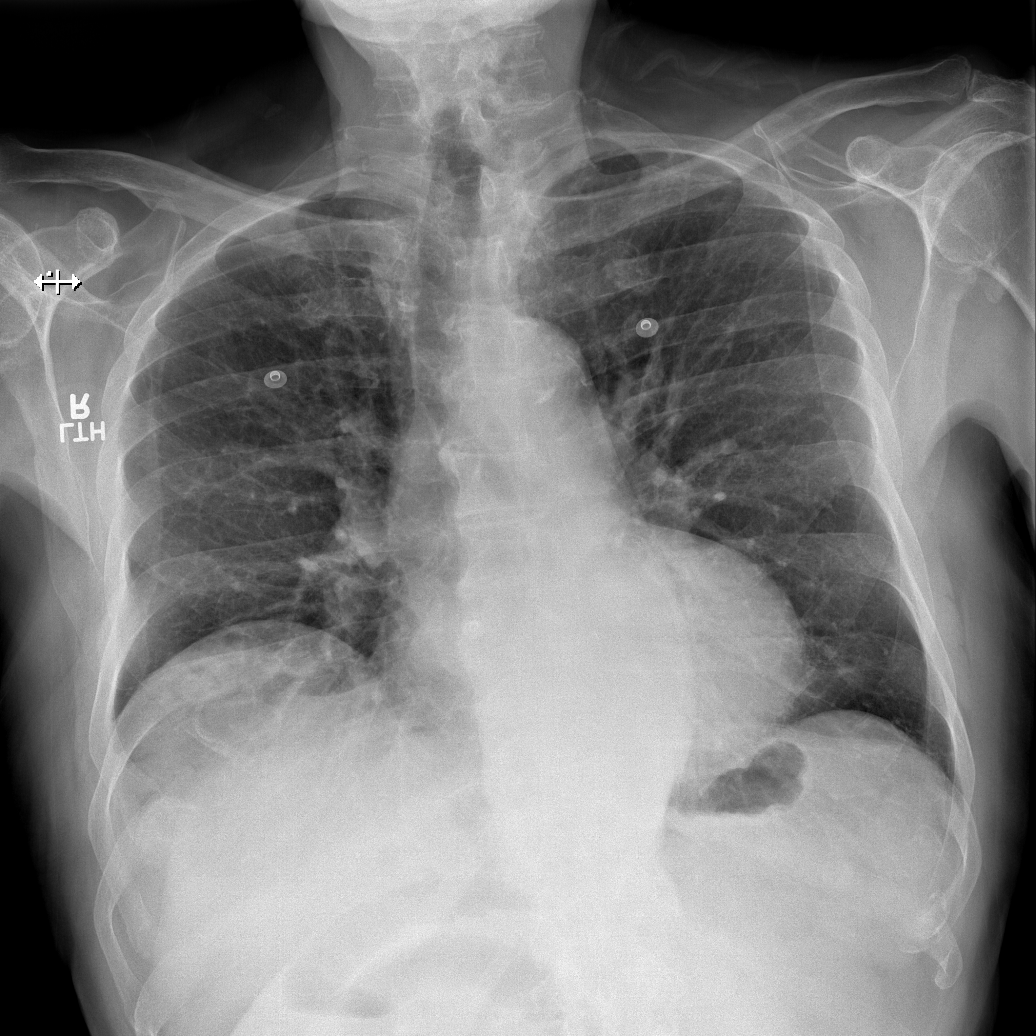
[im 2/5]
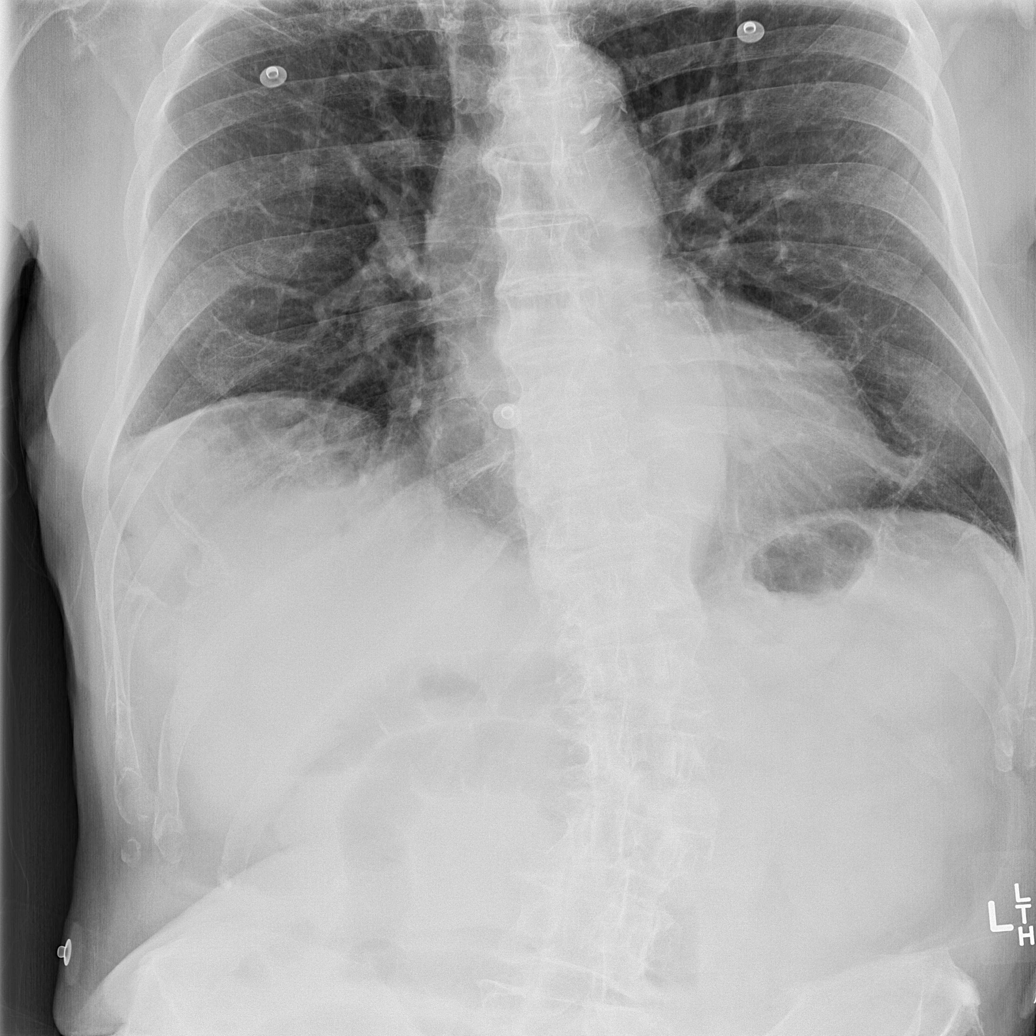
[im 3/5]
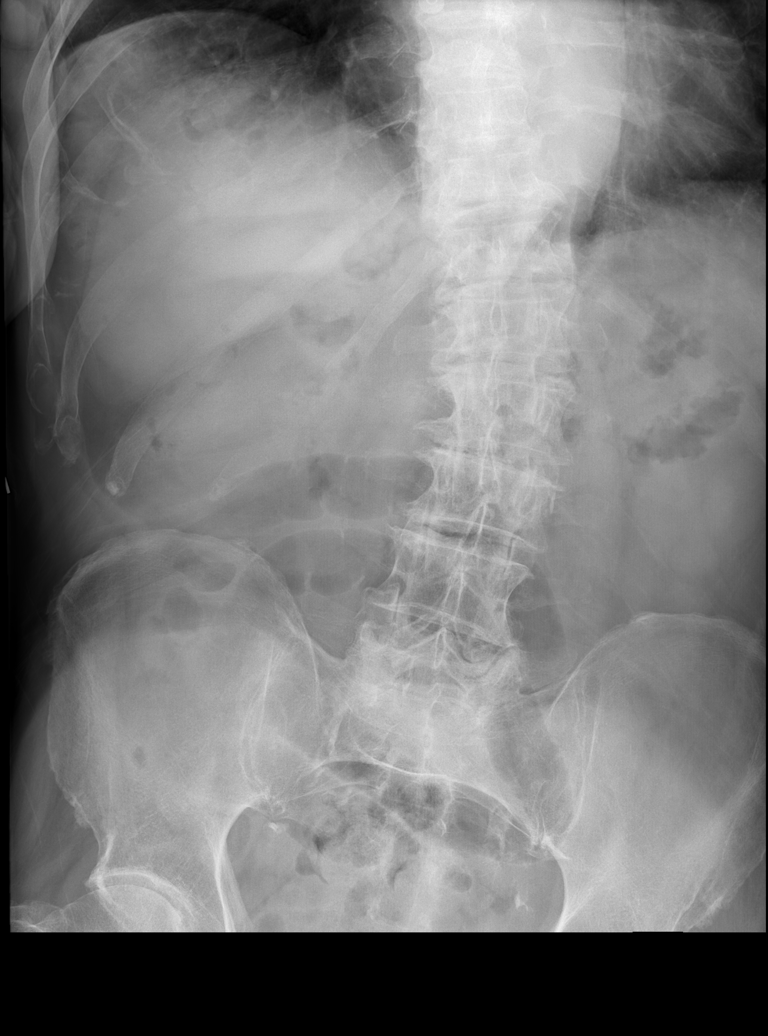
[im 4/5]
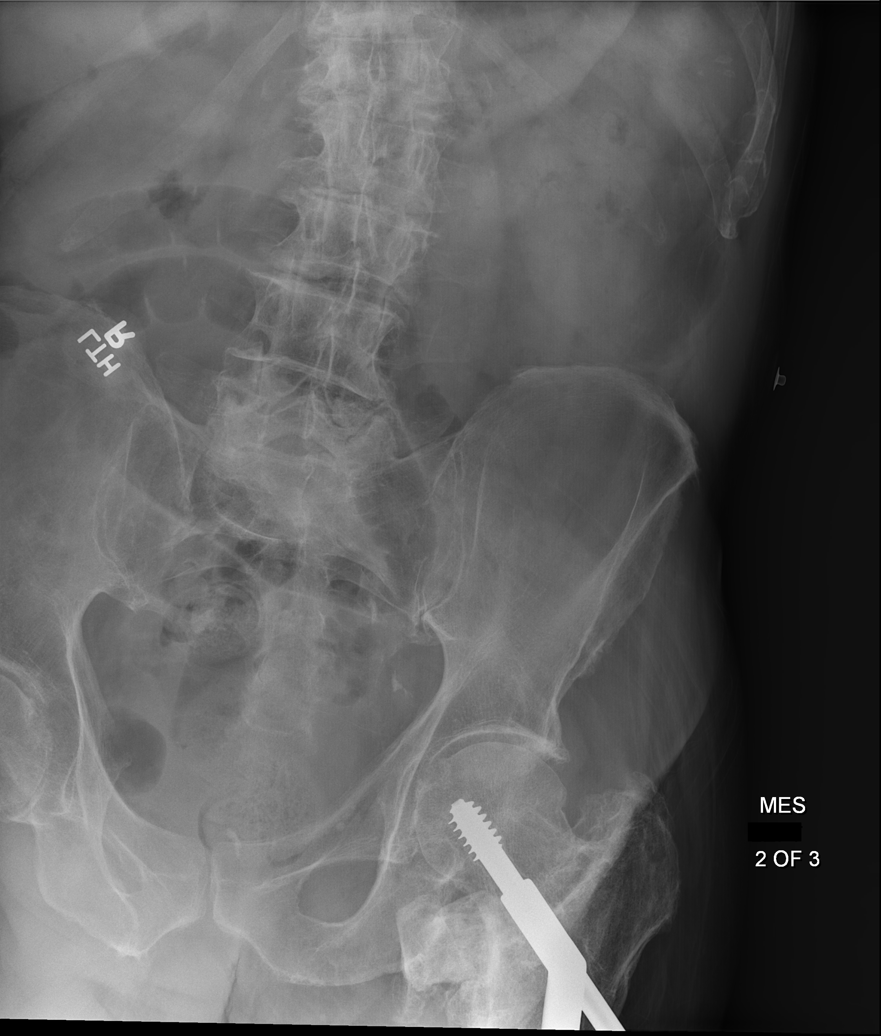
[im 5/5]
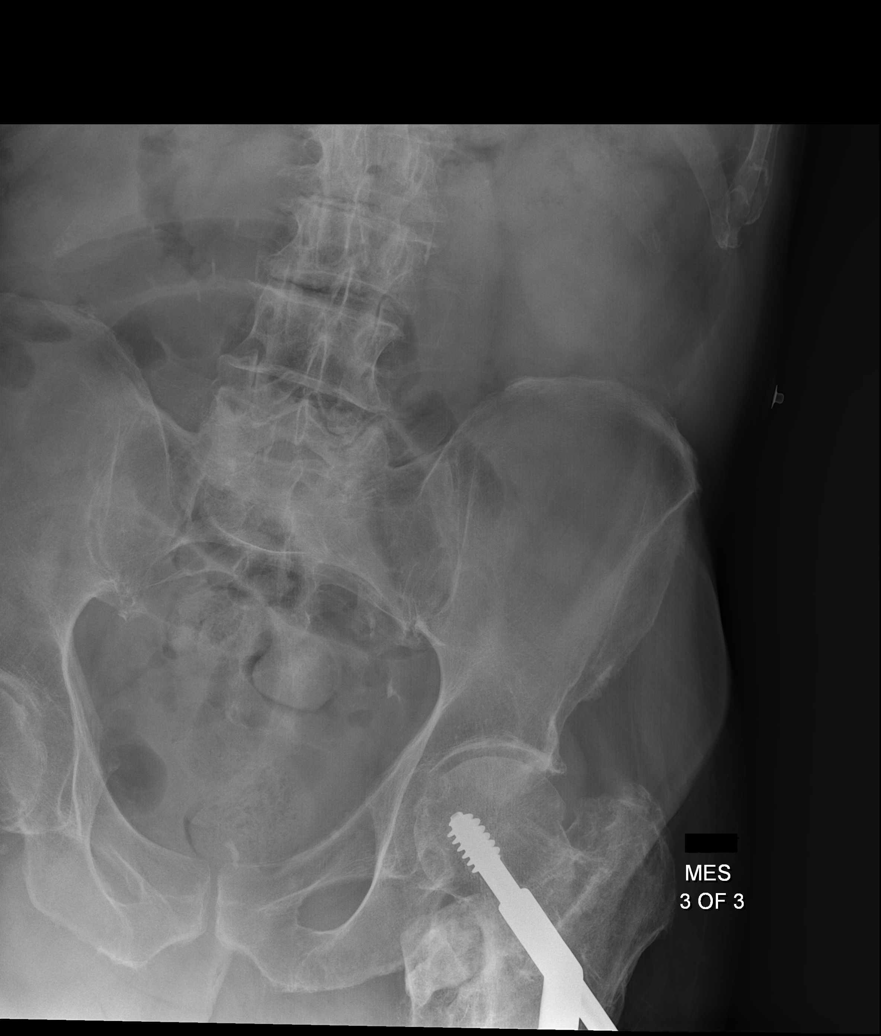

[5 of 5 positions shown; findings below may reference images not displayed]

FINDINGS: Normal cardiac silhouette. Aorta is ectatic. Right lower lobe
atelectasis and elevation the right hemidiaphragm.

There are no dilated loops of large or small bowel. There is a
moderate volume of stool in the sigmoid colon rectum. No pathologic
calcifications. No intraperitoneal free air. Scoliosis and
degenerative change of the lumbar spine. Remote fusion of the left
hip with heterotopic ossification.
IMPRESSION: 1. No evidence of acute pulmonary or cardiac findings.
2. Right lower lobe atelectasis.
3. No evidence of bowel obstruction.
4. Moderate volume stool in the rectum.
5.

## 2015-08-24 MED ORDER — IOPAMIDOL (ISOVUE-370) INJECTION 76%
75.0000 mL | Freq: Once | INTRAVENOUS | Status: AC | PRN
  Administered 2015-08-24: 60 mL via INTRAVENOUS

## 2015-08-24 MED ORDER — FUROSEMIDE 10 MG/ML IJ SOLN
40.0000 mg | Freq: Once | INTRAMUSCULAR | Status: AC
  Administered 2015-08-24: 40 mg via INTRAVENOUS
  Filled 2015-08-24: qty 4

## 2015-08-24 NOTE — ED Notes (Signed)
Pt in with co cp and shob for a month has seen Dr. Nehemiah Massed and has echo scheduled for Wednesday.  Symptoms worsened today, hx of MI and stent placement.

## 2015-08-24 NOTE — ED Provider Notes (Signed)
[facility] Emergency Department Provider Note   ____________________________________________  Time seen: ~1950  I have reviewed the triage vital signs and the nursing notes.   HISTORY  Chief Complaint Chest Pain   History limited by: Not Limited   HPI Ruben Gallegos is a 82 y.o. male who presents to the emergency department today because of concerns for shortness of breath. This is been going on for the past month. It is gradually been getting worse. He describes it as being a sense of suffocation. He denies any associated chest pain. No cough. Patient has seen his cardiologist was scheduled him for an echocardiogram next week. Patient has a history of chronic left leg swelling which she continues to have. No fevers.    Past Medical History  Diagnosis Date  . CHF (congestive heart failure) ([place])   . Diabetes mellitus without complication ([place])   . Hypertension   . Cancer ([place])     There are no active problems to display for this patient.   No past surgical history on file.  Current Outpatient Rx  Name  Route  Sig  Dispense  Refill  . carvedilol (COREG) 3.125 MG tablet   Oral   Take 3.125 mg by mouth 2 (two) times daily.         Marland Kitchen esomeprazole (NEXIUM) 40 MG capsule   Oral   Take 40 mg by mouth daily.         . furosemide (LASIX) 40 MG tablet   Oral   Take 40 mg by mouth 2 (two) times daily.          Marland Kitchen glipiZIDE (GLUCOTROL) 10 MG tablet   Oral   Take 10 mg by mouth 2 (two) times daily.         . insulin glargine (LANTUS) 100 UNIT/ML injection   Subcutaneous   Inject 26 Units into the skin at bedtime.         . isosorbide mononitrate (IMDUR) 30 MG 24 hr tablet   Oral   Take 30 mg by mouth daily.         Marland Kitchen levothyroxine (SYNTHROID, LEVOTHROID) 100 MCG tablet   Oral   Take 100 mcg by mouth daily.         . mirtazapine (REMERON) 7.5 MG tablet   Oral   Take 7.5 mg by mouth daily.         . nitroGLYCERIN  (NITROSTAT) 0.4 MG SL tablet   Sublingual   Place 0.4 mg under the tongue every 5 (five) minutes as needed for chest pain.         . potassium chloride (K-DUR) 10 MEQ tablet   Oral   Take 10 mEq by mouth daily.         Alveda Reasons 20 MG TABS tablet   Oral   Take 20 mg by mouth daily.           Dispense as written.     Allergies Altace; Tramadol; Actos; Codeine; Demerol; Lipitor; Metformin and related; and Mevacor  No family history on file.  Social History Social History  Substance Use Topics  . Smoking status: None  . Smokeless tobacco: None  . Alcohol Use: None    Review of Systems  Constitutional: Negative for fever. Cardiovascular: Negative for chest pain. Respiratory: Positive for shortness of breath. Gastrointestinal: Negative for abdominal pain, vomiting and diarrhea. Neurological: Negative for headaches, focal weakness or numbness.  10-point ROS otherwise negative.  ____________________________________________  PHYSICAL EXAM:  VITAL SIGNS: ED Triage Vitals  Enc Vitals Group     BP 08/24/15 1940 113/76 mmHg     Pulse Rate 08/24/15 1940 91     Resp 08/24/15 1940 34     Temp 08/24/15 1940 98.4 F (36.9 C)     Temp Source 08/24/15 1940 Oral     SpO2 08/24/15 1940 85 %     Weight 08/24/15 1928 180 lb (81.647 kg)     Height 08/24/15 1928 6' (1.829 m)     Head Cir --      Peak Flow --      Pain Score 08/24/15 1929 5   Constitutional: Alert and oriented. Well appearing and in no distress. Eyes: Conjunctivae are normal. PERRL. Normal extraocular movements. ENT   Head: Normocephalic and atraumatic.   Nose: No congestion/rhinnorhea.   Mouth/Throat: Mucous membranes are moist.   Neck: No stridor. Hematological/Lymphatic/Immunilogical: No cervical lymphadenopathy. Cardiovascular: Normal rate, regular rhythm.  No murmurs, rubs, or gallops. Respiratory: Normal respiratory effort without tachypnea nor retractions. Breath sounds are clear  and equal bilaterally. No wheezes/rales/rhonchi. Gastrointestinal: Soft and nontender. No distention.  Genitourinary: Deferred Musculoskeletal: Normal range of motion in all extremities. No joint effusions.  No lower extremity tenderness nor edema. Neurologic:  Normal speech and language. No gross focal neurologic deficits are appreciated.  Skin:  Skin is warm, dry and intact. No rash noted. Psychiatric: Mood and affect are normal. Speech and behavior are normal. Patient exhibits appropriate insight and judgment.  ____________________________________________    LABS (pertinent positives/negatives)  Labs Reviewed  CBC WITH DIFFERENTIAL/PLATELET - Abnormal; Notable for the following:    WBC 11.4 (*)    Neutro Abs 9.6 (*)    Lymphs Abs 0.9 (*)    All other components within normal limits  BASIC METABOLIC PANEL - Abnormal; Notable for the following:    Glucose, Bld 155 (*)    Calcium 8.6 (*)    All other components within normal limits  TROPONIN I - Abnormal; Notable for the following:    Troponin I 0.05 (*)    All other components within normal limits  BRAIN NATRIURETIC PEPTIDE - Abnormal; Notable for the following:    B Natriuretic Peptide 367.0 (*)    All other components within normal limits     ____________________________________________   EKG  I, Nance Pear, attending physician, personally viewed and interpreted this EKG  EKG Time: 1933 Rate: 97 Rhythm: sinus rhythm with 1st degree av block Axis: left axis deviation Intervals: qtc 464 QRS: LVH ST changes: no st elevation Impression: abnormal ekg  ____________________________________________    RADIOLOGY  CXR IMPRESSION: Mild cardiomegaly with findings suggesting minimal vascular congestion.  CT angio PE IMPRESSION: 1. No acute pulmonary embolus. Previous right upper lobe pulmonary embolus has resolved. Diminished webs in the lower lobe pulmonary arteries, persisting on the left consistent with  chronic/prior thromboembolic disease. 2. Upper lobe ground-glass opacities in smooth septal thickening this can be seen with pulmonary edema, however was seen on prior exam to lesser extent. Inflammatory etiologies or chronic lung disease are also considered. Some of these areas appear confluent or nodular, and follow-up imaging may be warranted if the patient has no clinical signs of pulmonary edema. There is lower lobe bronchial thickening and atelectasis. 3. Stable biapical pleural parenchymal scarring. Stable 4 mm left upper lobe pulmonary nodule dating back 2013, benign.  ____________________________________________   PROCEDURES  Procedure(s) performed: None  Critical Care performed: No  ____________________________________________   INITIAL  IMPRESSION / ASSESSMENT AND PLAN / ED COURSE  Pertinent labs & imaging results that were available during my care of the patient were reviewed by me and considered in my medical decision making (see chart for details).  Patient presented to the emergency department today with 1 month of worsening shortness of breath. The patient's O2 saturation was in the mid 80s on room air so nursing staff put him on oxygen. Auscultation was clear. Will send off blood work. Chest x-ray.  ----------------------------------------- 11:54 PM on 08/24/2015 -----------------------------------------  CT angiogram did not show any acute pulmonary embolisms. It does however show some pulmonary edema. This point I think patient suffering likely from a CHF exacerbation. Will plan on giving IV Lasix and admitted to the hospital service.  ____________________________________________   FINAL CLINICAL IMPRESSION(S) / ED DIAGNOSES  Final diagnoses:  Shortness of breath  Congestive heart failure, unspecified congestive heart failure chronicity, unspecified congestive heart failure type [facility])     Note: This dictation was prepared with Dragon dictation. Any  transcriptional errors that result from this process are unintentional    Nance Pear, MD 08/24/15 2354

## 2015-08-24 NOTE — ED Notes (Signed)
Patient transported to CT 

## 2015-08-24 NOTE — ED Notes (Signed)
MD at bedside. 

## 2015-08-24 NOTE — ED Notes (Signed)
Patient transported to X-ray 

## 2015-08-25 ENCOUNTER — Inpatient Hospital Stay
Admit: 2015-08-25 | Discharge: 2015-08-25 | Disposition: A | Discharge status: Home / Self Care | Payer: Medicare Other | Attending: Internal Medicine | Admitting: Internal Medicine

## 2015-08-25 ENCOUNTER — Encounter: Payer: Self-pay | Admitting: Internal Medicine

## 2015-08-25 DIAGNOSIS — Z888 Allergy status to other drugs, medicaments and biological substances status: Secondary | ICD-10-CM | POA: Diagnosis not present

## 2015-08-25 DIAGNOSIS — I5023 Acute on chronic systolic (congestive) heart failure: Secondary | ICD-10-CM | POA: Diagnosis present

## 2015-08-25 DIAGNOSIS — E039 Hypothyroidism, unspecified: Secondary | ICD-10-CM | POA: Diagnosis present

## 2015-08-25 DIAGNOSIS — Z87891 Personal history of nicotine dependence: Secondary | ICD-10-CM | POA: Diagnosis not present

## 2015-08-25 DIAGNOSIS — E119 Type 2 diabetes mellitus without complications: Secondary | ICD-10-CM

## 2015-08-25 DIAGNOSIS — I10 Essential (primary) hypertension: Secondary | ICD-10-CM | POA: Diagnosis present

## 2015-08-25 DIAGNOSIS — K219 Gastro-esophageal reflux disease without esophagitis: Secondary | ICD-10-CM | POA: Diagnosis present

## 2015-08-25 DIAGNOSIS — Z66 Do not resuscitate: Secondary | ICD-10-CM | POA: Diagnosis present

## 2015-08-25 DIAGNOSIS — Z79899 Other long term (current) drug therapy: Secondary | ICD-10-CM | POA: Diagnosis not present

## 2015-08-25 DIAGNOSIS — I959 Hypotension, unspecified: Secondary | ICD-10-CM | POA: Diagnosis not present

## 2015-08-25 DIAGNOSIS — R0602 Shortness of breath: Secondary | ICD-10-CM | POA: Diagnosis present

## 2015-08-25 DIAGNOSIS — Z7901 Long term (current) use of anticoagulants: Secondary | ICD-10-CM | POA: Diagnosis not present

## 2015-08-25 DIAGNOSIS — E785 Hyperlipidemia, unspecified: Secondary | ICD-10-CM | POA: Diagnosis present

## 2015-08-25 DIAGNOSIS — Z86711 Personal history of pulmonary embolism: Secondary | ICD-10-CM | POA: Diagnosis not present

## 2015-08-25 DIAGNOSIS — I110 Hypertensive heart disease with heart failure: Secondary | ICD-10-CM | POA: Diagnosis present

## 2015-08-25 DIAGNOSIS — Z885 Allergy status to narcotic agent status: Secondary | ICD-10-CM | POA: Diagnosis not present

## 2015-08-25 DIAGNOSIS — E876 Hypokalemia: Secondary | ICD-10-CM | POA: Diagnosis present

## 2015-08-25 DIAGNOSIS — R748 Abnormal levels of other serum enzymes: Secondary | ICD-10-CM | POA: Diagnosis present

## 2015-08-25 DIAGNOSIS — Z86718 Personal history of other venous thrombosis and embolism: Secondary | ICD-10-CM | POA: Diagnosis not present

## 2015-08-25 LAB — GLUCOSE, CAPILLARY
GLUCOSE-CAPILLARY: 152 mg/dL — AB (ref 65–99)
GLUCOSE-CAPILLARY: 137 mg/dL — AB (ref 65–99)
GLUCOSE-CAPILLARY: 192 mg/dL — AB (ref 65–99)
Glucose-Capillary: 231 mg/dL — ABNORMAL HIGH (ref 65–99)

## 2015-08-25 LAB — TROPONIN I
TROPONIN I: 0.04 ng/mL — AB (ref <0.031)
TROPONIN I: 0.06 ng/mL — AB (ref <0.031)
Troponin I: 0.06 ng/mL — ABNORMAL HIGH (ref <0.031)

## 2015-08-25 LAB — BASIC METABOLIC PANEL
ANION GAP: 6 (ref 5–15)
BUN: 15 mg/dL (ref 6–20)
CALCIUM: 8.5 mg/dL — AB (ref 8.9–10.3)
CO2: 25 mmol/L (ref 22–32)
Chloride: 105 mmol/L (ref 101–111)
Creatinine, Ser: 0.77 mg/dL (ref 0.61–1.24)
GFR calc non Af Amer: >60 mL/min (ref >60)
Glucose, Bld: 239 mg/dL — ABNORMAL HIGH (ref 65–99)
POTASSIUM: 3.6 mmol/L (ref 3.5–5.1)
Sodium: 136 mmol/L (ref 135–145)

## 2015-08-25 LAB — CBC
HEMATOCRIT: 43.5 % (ref 40.0–52.0)
Hemoglobin: 14.9 g/dL (ref 13.0–18.0)
MCH: 33.1 pg (ref 26.0–34.0)
MCHC: 34.3 g/dL (ref 32.0–36.0)
MCV: 96.3 fL (ref 80.0–100.0)
Platelets: 165 10*3/uL (ref 150–440)
RBC: 4.52 MIL/uL (ref 4.40–5.90)
RDW: 13.4 % (ref 11.5–14.5)
WBC: 9.2 10*3/uL (ref 3.8–10.6)

## 2015-08-25 LAB — HEMOGLOBIN A1C: Hgb A1c MFr Bld: 8.7 % — ABNORMAL HIGH (ref 4.0–6.0)

## 2015-08-25 LAB — MRSA PCR SCREENING: MRSA by PCR: POSITIVE — AB

## 2015-08-25 MED ORDER — ACETAMINOPHEN 650 MG RE SUPP: 650.0000 mg | Freq: Four times a day (QID) | RECTAL | Status: DC | PRN

## 2015-08-25 MED ORDER — INSULIN ASPART 100 UNIT/ML ~~LOC~~ SOLN: 0.0000 [IU] | Freq: Every day | SUBCUTANEOUS | Status: DC

## 2015-08-25 MED ORDER — FUROSEMIDE 10 MG/ML IJ SOLN
40.0000 mg | Freq: Two times a day (BID) | INTRAMUSCULAR | Status: DC
  Administered 2015-08-25: 40 mg via INTRAVENOUS
  Filled 2015-08-25: qty 4

## 2015-08-25 MED ORDER — FUROSEMIDE 40 MG PO TABS
40.0000 mg | ORAL_TABLET | Freq: Every day | ORAL | Status: DC
  Administered 2015-08-26 – 2015-08-27 (×2): 40 mg via ORAL
  Filled 2015-08-25 (×2): qty 1

## 2015-08-25 MED ORDER — ONDANSETRON HCL 4 MG/2ML IJ SOLN: 4.0000 mg | Freq: Four times a day (QID) | INTRAMUSCULAR | Status: DC | PRN

## 2015-08-25 MED ORDER — FUROSEMIDE 40 MG PO TABS
40.0000 mg | ORAL_TABLET | Freq: Two times a day (BID) | ORAL | Status: DC
  Administered 2015-08-25: 40 mg via ORAL
  Filled 2015-08-25: qty 1

## 2015-08-25 MED ORDER — CHLORHEXIDINE GLUCONATE CLOTH 2 % EX PADS
6.0000 | MEDICATED_PAD | Freq: Every day | CUTANEOUS | Status: DC
  Administered 2015-08-25 – 2015-08-27 (×3): 6 via TOPICAL

## 2015-08-25 MED ORDER — ISOSORBIDE MONONITRATE ER 30 MG PO TB24
30.0000 mg | ORAL_TABLET | Freq: Every day | ORAL | Status: DC
  Administered 2015-08-25 – 2015-08-26 (×2): 30 mg via ORAL
  Filled 2015-08-25 (×2): qty 1

## 2015-08-25 MED ORDER — SODIUM CHLORIDE 0.9% FLUSH
3.0000 mL | Freq: Two times a day (BID) | INTRAVENOUS | Status: DC
  Administered 2015-08-25 – 2015-08-26 (×5): 3 mL via INTRAVENOUS

## 2015-08-25 MED ORDER — CARVEDILOL 3.125 MG PO TABS
3.1250 mg | ORAL_TABLET | Freq: Two times a day (BID) | ORAL | Status: DC
  Administered 2015-08-25 – 2015-08-27 (×4): 3.125 mg via ORAL
  Filled 2015-08-25 (×4): qty 1

## 2015-08-25 MED ORDER — INSULIN ASPART 100 UNIT/ML ~~LOC~~ SOLN
0.0000 [IU] | Freq: Three times a day (TID) | SUBCUTANEOUS | Status: DC
  Administered 2015-08-25: 2 [IU] via SUBCUTANEOUS
  Administered 2015-08-25: 3 [IU] via SUBCUTANEOUS
  Administered 2015-08-25: 1 [IU] via SUBCUTANEOUS
  Administered 2015-08-26 (×2): 2 [IU] via SUBCUTANEOUS
  Administered 2015-08-26: 3 [IU] via SUBCUTANEOUS
  Administered 2015-08-27: 2 [IU] via SUBCUTANEOUS
  Filled 2015-08-25: qty 1
  Filled 2015-08-25 (×2): qty 3
  Filled 2015-08-25 (×4): qty 2

## 2015-08-25 MED ORDER — LEVOTHYROXINE SODIUM 100 MCG PO TABS
100.0000 ug | ORAL_TABLET | Freq: Every day | ORAL | Status: DC
  Administered 2015-08-25 – 2015-08-27 (×3): 100 ug via ORAL
  Filled 2015-08-25 (×3): qty 1

## 2015-08-25 MED ORDER — ONDANSETRON HCL 4 MG PO TABS: 4.0000 mg | ORAL_TABLET | Freq: Four times a day (QID) | ORAL | Status: DC | PRN

## 2015-08-25 MED ORDER — MUPIROCIN 2 % EX OINT
1.0000 "application " | TOPICAL_OINTMENT | Freq: Two times a day (BID) | CUTANEOUS | Status: DC
  Administered 2015-08-25 – 2015-08-27 (×5): 1 via NASAL
  Filled 2015-08-25: qty 22

## 2015-08-25 MED ORDER — ACETAMINOPHEN 325 MG PO TABS: 650.0000 mg | ORAL_TABLET | Freq: Four times a day (QID) | ORAL | Status: DC | PRN

## 2015-08-25 MED ORDER — RIVAROXABAN 20 MG PO TABS
20.0000 mg | ORAL_TABLET | Freq: Every day | ORAL | Status: DC
  Administered 2015-08-25 – 2015-08-26 (×2): 20 mg via ORAL
  Filled 2015-08-25 (×2): qty 1

## 2015-08-25 MED ORDER — PANTOPRAZOLE SODIUM 40 MG PO TBEC
40.0000 mg | DELAYED_RELEASE_TABLET | Freq: Every day | ORAL | Status: DC
  Administered 2015-08-25 – 2015-08-27 (×3): 40 mg via ORAL
  Filled 2015-08-25 (×3): qty 1

## 2015-08-25 NOTE — Progress Notes (Signed)
Patient is in good spirit. [name] feels as if his health is getting better, slowly, but better. He is eating and was able to carry on a good conversation. Chaplain will continue to check in on him throughout the day and encourage him.

## 2015-08-25 NOTE — Progress Notes (Signed)
MD Bridgett Larsson notified that patient's output is low for the day, asked if he should be switched to IV lasix, MD will evaluate meds

## 2015-08-25 NOTE — Consult Note (Signed)
[place]  CARDIOLOGY CONSULT NOTE  Patient ID: Ruben Gallegos MRN: [id number] DOB/AGE: February 14, 1921 82 y.o.  Admit date: 08/24/2015 Referring Physician Dr. Bridgett Larsson Primary Physician  Dr. Caryl Comes Primary Cardiologist Dr. Nehemiah Massed Reason for Consultation chf  HPI: 82 yo male with history of dm, hypertension, chf who was dmitted with sob and noted to have mild cardiomegaly and findings suggesting minimal vascular congestion. Troponin was 0.06.He is a difficult historian but has improved with diuresis. He has improved with diiuresis. He is on xarelto for history of dvt.   Review of Systems  Constitutional: Negative.   HENT: Negative.   Eyes: Negative.   Respiratory: Positive for shortness of breath.   Gastrointestinal: Negative.   Genitourinary: Negative.   Musculoskeletal: Negative.   Skin: Negative.   Neurological: Negative.   Endo/Heme/Allergies: Negative.   Psychiatric/Behavioral: Negative.     Past Medical History  Diagnosis Date  . CHF (congestive heart failure) ([place])   . Diabetes mellitus without complication ([place])   . Hypertension   . Cancer ([place])   . Hypothyroidism   . HLD (hyperlipidemia)   . GERD (gastroesophageal reflux disease)     Family History  Problem Relation Age of Onset  . CAD    . Diabetes      Social History   Social History  . Marital Status: Widowed    Spouse Name: N/A  . Number of Children: N/A  . Years of Education: N/A   Occupational History  . Not on file.   Social History Main Topics  . Smoking status: Former Research scientist (life sciences)  . Smokeless tobacco: Not on file  . Alcohol Use: 0.0 oz/week    0 Standard drinks or equivalent per week  . Drug Use: No  . Sexual Activity: Not on file   Other Topics Concern  . Not on file   Social History Narrative    Past Surgical History  Procedure Laterality Date  . Appendectomy    . Cataract extraction    . Hernia repair       Prescriptions prior to admission   Medication Sig Dispense Refill Last Dose  . carvedilol (COREG) 3.125 MG tablet Take 3.125 mg by mouth 2 (two) times daily.   08/24/2015 at am  . esomeprazole (NEXIUM) 40 MG capsule Take 40 mg by mouth daily.   08/23/2015 at Unknown time  . furosemide (LASIX) 40 MG tablet Take 40 mg by mouth 2 (two) times daily.    08/24/2015 at am  . glipiZIDE (GLUCOTROL) 10 MG tablet Take 10 mg by mouth 2 (two) times daily.   08/24/2015 at am  . insulin glargine (LANTUS) 100 UNIT/ML injection Inject 26 Units into the skin at bedtime.   08/23/2015 at Unknown time  . isosorbide mononitrate (IMDUR) 30 MG 24 hr tablet Take 30 mg by mouth daily.   08/23/2015 at Unknown time  . levothyroxine (SYNTHROID, LEVOTHROID) 100 MCG tablet Take 100 mcg by mouth daily.   08/24/2015 at Unknown time  . mirtazapine (REMERON) 7.5 MG tablet Take 7.5 mg by mouth daily.     . nitroGLYCERIN (NITROSTAT) 0.4 MG SL tablet Place 0.4 mg under the tongue every 5 (five) minutes as needed for chest pain.     . potassium chloride (K-DUR) 10 MEQ tablet Take 10 mEq by mouth daily.   08/24/2015 at Unknown time  . XARELTO 20 MG TABS tablet Take 20 mg by mouth daily.   08/24/2015 at am    Physical Exam: Blood  pressure 104/64, pulse 78, temperature 98.6 F (37 C), temperature source Oral, resp. rate 16, height 6\' 1"  (1.854 m), weight 75.615 kg (166 lb 11.2 oz), SpO2 91 %.   Wt Readings from Last 1 Encounters:  08/25/15 75.615 kg (166 lb 11.2 oz)     General appearance: cooperative and slowed mentation Head: Normocephalic, without obvious abnormality, atraumatic Resp: clear to auscultation bilaterally Cardio: regular rate and rhythm GI: soft, non-tender; bowel sounds normal; no masses,  no organomegaly Neurologic: Grossly normal  Labs:   Lab Results  Component Value Date   WBC 9.2 08/25/2015   HGB 14.9 08/25/2015   HCT 43.5 08/25/2015   MCV 96.3 08/25/2015   PLT 165 08/25/2015    Recent Labs Lab 08/25/15 0126  NA 136  K 3.6  CL 105   CO2 25  BUN 15  CREATININE 0.77  CALCIUM 8.5*  GLUCOSE 239*   Lab Results  Component Value Date   TROPONINI 0.06* 08/25/2015      Radiology: cxr-mild cardiomegaly with minimal pulmonary vascular congestion EKG: nsr with ivcd.  ASSESSMENT AND PLAN:  82 yo admitted with sob and mild pulmonary edema. Has imprved with gentle diursis. Ruled out for mi. Would convert to po lasix and continue to follow for improved sx. Will follow with you. Echo pending Signed: Teodoro Spray MD, [facility] 08/25/2015, 9:18 PM

## 2015-08-25 NOTE — Progress Notes (Addendum)
[facility] [place] NAME: Ruben Gallegos    MR#:  [id number]  DATE OF BIRTH:  12-02-1920  SUBJECTIVE:  CHIEF COMPLAINT:   Chief Complaint  Patient presents with  . Chest Pain   No chest pain but has cough and shortness of breath. REVIEW OF SYSTEMS:  CONSTITUTIONAL: No fever, fatigue or weakness.  EYES: No blurred or double vision.  EARS, NOSE, AND THROAT: No tinnitus or ear pain.  RESPIRATORY: has cough, shortness of breath, no wheezing or hemoptysis.  CARDIOVASCULAR: No chest pain, orthopnea, has leg edema.  GASTROINTESTINAL: No nausea, vomiting, diarrhea or abdominal pain.  GENITOURINARY: No dysuria, hematuria.  ENDOCRINE: No polyuria, nocturia,  HEMATOLOGY: No anemia, easy bruising or bleeding SKIN: No rash or lesion. MUSCULOSKELETAL: No joint pain or arthritis.   NEUROLOGIC: No tingling, numbness, weakness.  PSYCHIATRY: No anxiety or depression.   DRUG ALLERGIES:   Allergies  Allergen Reactions  . Altace [Ramipril] Shortness Of Breath  . Tramadol Shortness Of Breath  . Actos [Pioglitazone] Swelling  . Codeine Nausea Only  . Demerol [Meperidine] Nausea Only  . Lipitor [Atorvastatin] Other (See Comments)  . Metformin And Related Other (See Comments)  . Mevacor [Lovastatin] Other (See Comments)    VITALS:  Blood pressure 104/64, pulse 78, temperature 98.6 F (37 C), temperature source Oral, resp. rate 16, height 6\' 1"  (1.854 m), weight 75.615 kg (166 lb 11.2 oz), SpO2 89 %.  PHYSICAL EXAMINATION:  GENERAL:  82 y.o.-year-old patient lying in the bed with no acute distress.  EYES: Pupils equal, round, reactive to light and accommodation. No scleral icterus. Extraocular muscles intact.  HEENT: Head atraumatic, normocephalic. Oropharynx and nasopharynx clear.  NECK:  Supple, no jugular venous distention. No thyroid enlargement, no tenderness.  LUNGS: Normal breath sounds bilaterally, no wheezing, but has rales, no  rhonchi or crepitation. No use of accessory muscles of respiration.  CARDIOVASCULAR: S1, S2 normal. No murmurs, rubs, or gallops.  ABDOMEN: Soft, nontender, nondistended. Bowel sounds present. No organomegaly or mass.  EXTREMITIES: trace leg edema, no cyanosis, or clubbing.  NEUROLOGIC: Cranial nerves II through XII are intact. Muscle strength 5/5 in all extremities. Sensation intact. Gait not checked.  PSYCHIATRIC: The patient is alert and oriented x 3.  SKIN: No obvious rash, lesion, or ulcer.    LABORATORY PANEL:   CBC  Recent Labs Lab 08/25/15 0126  WBC 9.2  HGB 14.9  HCT 43.5  PLT 165   ------------------------------------------------------------------------------------------------------------------  Chemistries   Recent Labs Lab 08/25/15 0126  NA 136  K 3.6  CL 105  CO2 25  GLUCOSE 239*  BUN 15  CREATININE 0.77  CALCIUM 8.5*   ------------------------------------------------------------------------------------------------------------------  Cardiac Enzymes  Recent Labs Lab 08/25/15 1339  TROPONINI 0.06*   ------------------------------------------------------------------------------------------------------------------  RADIOLOGY:  Dg Chest 2 View  08/24/2015  CLINICAL DATA:  Chest pain shortness of breath 1 month. Symptoms worse today. EXAM: CHEST  2 VIEW COMPARISON:  03/02/2014 and CT 03/02/2014 FINDINGS: Lungs are somewhat hypoinflated with mild prominence the perihilar markings likely mild vascular congestion. Minimal linear density in the left base likely atelectasis or scarring. No evidence of effusion. Mild stable cardiomegaly. Calcified plaque over the thoracic aorta. There mild degenerate changes of the spine. IMPRESSION: Mild cardiomegaly with findings suggesting minimal vascular congestion. Electronically Signed   By: Marin Olp M.D.   On: 08/24/2015 20:25   Ct Angio Chest Pe W/cm &/or Wo Cm  08/24/2015  CLINICAL DATA:  Chest pain and  shortness  of breath for 1 month, symptoms worsened today. EXAM: CT ANGIOGRAPHY CHEST WITH CONTRAST TECHNIQUE: Multidetector CT imaging of the chest was performed using the standard protocol during bolus administration of intravenous contrast. Multiplanar CT image reconstructions and MIPs were obtained to evaluate the vascular anatomy. CONTRAST:  60 mL Isovue 370 IV COMPARISON:  Radiograph earlier this day. Chest CT PE protocol 03/02/2014. Additional prior CTs dating back to 2013. FINDINGS: Right upper lobe pulmonary artery filling defect on prior exam is no longer seen. Pulmonary arterial webs and sequela of chronic/prior embolus in the left lower lobe pulmonary arteries persist, have diminished in the right lower lobe from prior and near completely resolved. No new filling defects to suggest acute pulmonary embolus. Tortuous thoracic aorta with atherosclerosis. Limited assessment for dissection given phase of contrast,, however no findings to suggest aortic dissection. No pericardial effusion. Coronary artery calcifications versus stents. Multifocal mediastinal and bilateral hilar adenopathy. Prevascular/AP window lymph node measures 1.7 cm short axis unchanged. Pretracheal lymph node measures 10 mm, previously 13 mm left upper paratracheal lymph node measures 1.6 cm, increased. Left hilar node measures 1.3 cm, unchanged, right hilar node 1.3 cm mildly increased. Upper lobe dependent ground-glass opacities in smooth septal thickening, seen previously but progressed in the interim. Some of these ground-glass opacities appear consolidative/ nodule or, for example right upper lobe series 6, image 53. 4 mm left upper lobe nodule series 6, image 49 is unchanged dating back to 2013 and considered benign. Mild lower lobe bronchial thickening and atelectasis. Scattered fissural thickening bilaterally. No frank pleural effusion. Biapical pleural parenchymal scarring with pleural calcifications, left greater than right. No acute  abnormality in the included upper abdomen. Multilevel degenerative change throughout the spine with diffuse degenerative disc disease. There are no acute or suspicious osseous abnormalities. Review of the MIP images confirms the above findings. IMPRESSION: 1. No acute pulmonary embolus. Previous right upper lobe pulmonary embolus has resolved. Diminished webs in the lower lobe pulmonary arteries, persisting on the left consistent with chronic/prior thromboembolic disease. 2. Upper lobe ground-glass opacities in smooth septal thickening this can be seen with pulmonary edema, however was seen on prior exam to lesser extent. Inflammatory etiologies or chronic lung disease are also considered. Some of these areas appear confluent or nodular, and follow-up imaging may be warranted if the patient has no clinical signs of pulmonary edema. There is lower lobe bronchial thickening and atelectasis. 3. Stable biapical pleural parenchymal scarring. Stable 4 mm left upper lobe pulmonary nodule dating back 2013, benign. Electronically Signed   By: Jeb Levering M.D.   On: 08/24/2015 23:37    EKG:   Orders placed or performed during the hospital encounter of 08/24/15  . EKG 12-Lead  . EKG 12-Lead  . ED EKG  . ED EKG    ASSESSMENT AND PLAN:   Acute on chronic systolic CHF  Given IV Lasix given in the ED  Continue Lasix, coreg,  follow-up echocardiogram and cardiology consult. Follow-up BMP.  Elevated troponin. Due to CHF. On xarelto.   Diabetes ([place]) - sliding scale insulin with corresponding glucose checks and carb modified diet   HTN (hypertension) - currently stable, continue home meds   Hypothyroidism - home dose thyroid replacement   GERD (gastroesophageal reflux disease) - home dose PPI  History of PE and DVT. Continue xarelto.  All the records are reviewed and case discussed with Care Management/Social Workerr. Management plans discussed with the patient, his 2 sons and they are in  agreement.  CODE STATUS: DO NOT RESUSCITATE  TOTAL TIME TAKING CARE OF THIS PATIENT: 42 minutes.  Greater than 50% time was spent on coordination of care and face-to-face counseling.  POSSIBLE D/C IN 2-3 DAYS, DEPENDING ON CLINICAL CONDITION.   Demetrios Loll M.D on 08/25/2015 at 4:44 PM  Between 7am to 6pm - Pager - [phone]  After 6pm go to [email or web address] - password [email or web address] [facility]  [facility]  Office  [phone]  CC: Primary care physician; [name], MD

## 2015-08-25 NOTE — H&P (Signed)
[facility] [employer] NAME: Ruben Gallegos    MR#:  [id number]  DATE OF BIRTH:  1920-10-12  DATE OF ADMISSION:  08/24/2015  PRIMARY CARE PHYSICIAN: [name], MD   REQUESTING/REFERRING PHYSICIAN: Archie Balboa, MD  CHIEF COMPLAINT:   Chief Complaint  Patient presents with  . Chest Pain    HISTORY OF PRESENT ILLNESS:  Ruben Gallegos  is a 82 y.o. male who presents with Progressive shortness of breath. Patient has a known history of heart failure and states that he is felt like he has slowly been getting more dyspneic, he has had some orthopnea, and today reached a point that he he "felt like he was drowning." His BNP was elevated here in the ED, and chest x-ray was consistent with heart failure. He was given some IV Lasix with good diuresis and improvement in his symptoms. Hospitals were called for admission  PAST MEDICAL HISTORY:   Past Medical History  Diagnosis Date  . CHF (congestive heart failure) ([place])   . Diabetes mellitus without complication ([place])   . Hypertension   . Cancer ([place])   . Hypothyroidism   . HLD (hyperlipidemia)   . GERD (gastroesophageal reflux disease)     PAST SURGICAL HISTORY:   Past Surgical History  Procedure Laterality Date  . Appendectomy    . Cataract extraction    . Hernia repair      SOCIAL HISTORY:   Social History  Substance Use Topics  . Smoking status: Former Research scientist (life sciences)  . Smokeless tobacco: Not on file  . Alcohol Use: 0.0 oz/week    0 Standard drinks or equivalent per week    FAMILY HISTORY:   Family History  Problem Relation Age of Onset  . CAD    . Diabetes      DRUG ALLERGIES:   Allergies  Allergen Reactions  . Altace [Ramipril] Shortness Of Breath  . Tramadol Shortness Of Breath  . Actos [Pioglitazone] Swelling  . Codeine Nausea Only  . Demerol [Meperidine] Nausea Only  . Lipitor [Atorvastatin] Other (See Comments)  . Metformin And Related Other (See Comments)  .  Mevacor [Lovastatin] Other (See Comments)    MEDICATIONS AT HOME:   Prior to Admission medications   Medication Sig Start Date End Date Taking? Authorizing Provider  carvedilol (COREG) 3.125 MG tablet Take 3.125 mg by mouth 2 (two) times daily.   Yes Historical Provider, MD  esomeprazole (NEXIUM) 40 MG capsule Take 40 mg by mouth daily.   Yes Historical Provider, MD  furosemide (LASIX) 40 MG tablet Take 40 mg by mouth 2 (two) times daily.    Yes Historical Provider, MD  glipiZIDE (GLUCOTROL) 10 MG tablet Take 10 mg by mouth 2 (two) times daily.   Yes Historical Provider, MD  insulin glargine (LANTUS) 100 UNIT/ML injection Inject 26 Units into the skin at bedtime.   Yes Historical Provider, MD  isosorbide mononitrate (IMDUR) 30 MG 24 hr tablet Take 30 mg by mouth daily.   Yes Historical Provider, MD  levothyroxine (SYNTHROID, LEVOTHROID) 100 MCG tablet Take 100 mcg by mouth daily.   Yes Historical Provider, MD  mirtazapine (REMERON) 7.5 MG tablet Take 7.5 mg by mouth daily.   Yes Historical Provider, MD  nitroGLYCERIN (NITROSTAT) 0.4 MG SL tablet Place 0.4 mg under the tongue every 5 (five) minutes as needed for chest pain.   Yes Historical Provider, MD  potassium chloride (K-DUR) 10 MEQ tablet Take 10 mEq by mouth  daily.   Yes Historical Provider, MD  XARELTO 20 MG TABS tablet Take 20 mg by mouth daily.   Yes Historical Provider, MD    REVIEW OF SYSTEMS:  Review of Systems  Constitutional: Negative for fever, chills, weight loss and malaise/fatigue.  HENT: Negative for ear pain, hearing loss and tinnitus.   Eyes: Negative for blurred vision, double vision, pain and redness.  Respiratory: Positive for cough and shortness of breath. Negative for hemoptysis.   Cardiovascular: Positive for orthopnea. Negative for chest pain, palpitations and leg swelling.  Gastrointestinal: Negative for nausea, vomiting, abdominal pain, diarrhea and constipation.  Genitourinary: Negative for dysuria,  frequency and hematuria.  Musculoskeletal: Negative for back pain, joint pain and neck pain.  Skin:       No acne, rash, or lesions  Neurological: Negative for dizziness, tremors, focal weakness and weakness.  Endo/Heme/Allergies: Negative for polydipsia. Does not bruise/bleed easily.  Psychiatric/Behavioral: Negative for depression. The patient is not nervous/anxious and does not have insomnia.      VITAL SIGNS:   Filed Vitals:   08/24/15 1951 08/24/15 2100 08/24/15 2200 08/24/15 2300  BP:  109/62 113/72 110/60  Pulse: 91 85 81 82  Temp:      TempSrc:      Resp: 30 25 38 31  Height:      Weight:      SpO2: 94% 94% 96% 97%   Wt Readings from Last 3 Encounters:  08/24/15 81.647 kg (180 lb)    PHYSICAL EXAMINATION:  Physical Exam  Vitals reviewed. Constitutional: He is oriented to person, place, and time. He appears well-developed and well-nourished. No distress.  HENT:  Head: Normocephalic and atraumatic.  Mouth/Throat: Oropharynx is clear and moist.  Eyes: Conjunctivae and EOM are normal. Pupils are equal, round, and reactive to light. No scleral icterus.  Neck: Normal range of motion. Neck supple. No JVD present. No thyromegaly present.  Cardiovascular: Normal rate, regular rhythm and intact distal pulses.  Exam reveals no gallop and no friction rub.   No murmur heard. Respiratory: Effort normal. No respiratory distress. He has no wheezes. He has rales (Bibasilar).  GI: Soft. Bowel sounds are normal. He exhibits no distension. There is no tenderness.  Musculoskeletal: Normal range of motion. He exhibits no edema.  No arthritis, no gout  Lymphadenopathy:    He has no cervical adenopathy.  Neurological: He is alert and oriented to person, place, and time. No cranial nerve deficit.  No dysarthria, no aphasia  Skin: Skin is warm and dry. No rash noted. No erythema.  Psychiatric: He has a normal mood and affect. His behavior is normal. Judgment and thought content normal.     LABORATORY PANEL:   CBC  Recent Labs Lab 08/24/15 1944  WBC 11.4*  HGB 14.6  HCT 43.3  PLT 164   ------------------------------------------------------------------------------------------------------------------  Chemistries   Recent Labs Lab 08/24/15 1944  NA 135  K 3.7  CL 105  CO2 22  GLUCOSE 155*  BUN 15  CREATININE 0.84  CALCIUM 8.6*   ------------------------------------------------------------------------------------------------------------------  Cardiac Enzymes  Recent Labs Lab 08/24/15 1944  TROPONINI 0.05*   ------------------------------------------------------------------------------------------------------------------  RADIOLOGY:  Dg Chest 2 View  08/24/2015  CLINICAL DATA:  Chest pain shortness of breath 1 month. Symptoms worse today. EXAM: CHEST  2 VIEW COMPARISON:  03/02/2014 and CT 03/02/2014 FINDINGS: Lungs are somewhat hypoinflated with mild prominence the perihilar markings likely mild vascular congestion. Minimal linear density in the left base likely atelectasis or scarring. No evidence of  effusion. Mild stable cardiomegaly. Calcified plaque over the thoracic aorta. There mild degenerate changes of the spine. IMPRESSION: Mild cardiomegaly with findings suggesting minimal vascular congestion. Electronically Signed   By: Marin Olp M.D.   On: 08/24/2015 20:25   Ct Angio Chest Pe W/cm &/or Wo Cm  08/24/2015  CLINICAL DATA:  Chest pain and shortness of breath for 1 month, symptoms worsened today. EXAM: CT ANGIOGRAPHY CHEST WITH CONTRAST TECHNIQUE: Multidetector CT imaging of the chest was performed using the standard protocol during bolus administration of intravenous contrast. Multiplanar CT image reconstructions and MIPs were obtained to evaluate the vascular anatomy. CONTRAST:  60 mL Isovue 370 IV COMPARISON:  Radiograph earlier this day. Chest CT PE protocol 03/02/2014. Additional prior CTs dating back to 2013. FINDINGS: Right upper lobe  pulmonary artery filling defect on prior exam is no longer seen. Pulmonary arterial webs and sequela of chronic/prior embolus in the left lower lobe pulmonary arteries persist, have diminished in the right lower lobe from prior and near completely resolved. No new filling defects to suggest acute pulmonary embolus. Tortuous thoracic aorta with atherosclerosis. Limited assessment for dissection given phase of contrast,, however no findings to suggest aortic dissection. No pericardial effusion. Coronary artery calcifications versus stents. Multifocal mediastinal and bilateral hilar adenopathy. Prevascular/AP window lymph node measures 1.7 cm short axis unchanged. Pretracheal lymph node measures 10 mm, previously 13 mm left upper paratracheal lymph node measures 1.6 cm, increased. Left hilar node measures 1.3 cm, unchanged, right hilar node 1.3 cm mildly increased. Upper lobe dependent ground-glass opacities in smooth septal thickening, seen previously but progressed in the interim. Some of these ground-glass opacities appear consolidative/ nodule or, for example right upper lobe series 6, image 53. 4 mm left upper lobe nodule series 6, image 49 is unchanged dating back to 2013 and considered benign. Mild lower lobe bronchial thickening and atelectasis. Scattered fissural thickening bilaterally. No frank pleural effusion. Biapical pleural parenchymal scarring with pleural calcifications, left greater than right. No acute abnormality in the included upper abdomen. Multilevel degenerative change throughout the spine with diffuse degenerative disc disease. There are no acute or suspicious osseous abnormalities. Review of the MIP images confirms the above findings. IMPRESSION: 1. No acute pulmonary embolus. Previous right upper lobe pulmonary embolus has resolved. Diminished webs in the lower lobe pulmonary arteries, persisting on the left consistent with chronic/prior thromboembolic disease. 2. Upper lobe ground-glass  opacities in smooth septal thickening this can be seen with pulmonary edema, however was seen on prior exam to lesser extent. Inflammatory etiologies or chronic lung disease are also considered. Some of these areas appear confluent or nodular, and follow-up imaging may be warranted if the patient has no clinical signs of pulmonary edema. There is lower lobe bronchial thickening and atelectasis. 3. Stable biapical pleural parenchymal scarring. Stable 4 mm left upper lobe pulmonary nodule dating back 2013, benign. Electronically Signed   By: Jeb Levering M.D.   On: 08/24/2015 23:37    EKG:   Orders placed or performed during the hospital encounter of 08/24/15  . EKG 12-Lead  . EKG 12-Lead  . ED EKG  . ED EKG    IMPRESSION AND PLAN:  Principal Problem:   Acute on chronic systolic CHF (congestive heart failure) ([facility]) - IV Lasix given in the ED with good improvement in symptoms. We will continue his home dose of diuretics at this point, get an echocardiogram, serially trend his neck enzymes tonight, and get a cardiology consult. Active Problems:   Diabetes (  [place]) - sliding scale insulin with corresponding glucose checks and carb modified diet   HTN (hypertension) - currently stable, continue home meds   Hypothyroidism - home dose thyroid replacement   GERD (gastroesophageal reflux disease) - home dose PPI  All the records are reviewed and case discussed with ED provider. Management plans discussed with the patient and/or family.  DVT PROPHYLAXIS: Systemic anticoagulation  GI PROPHYLAXIS: PPI  ADMISSION STATUS: Inpatient  CODE STATUS: DNR Code Status History    This patient does not have a recorded code status. Please follow your organizational policy for patients in this situation.      TOTAL TIME TAKING CARE OF THIS PATIENT: 45 minutes.    Ramla Hase [place] 08/25/2015, 12:21 AM  [facility]  Office  [phone]  CC: Primary care physician; [name], MD

## 2015-08-26 LAB — BASIC METABOLIC PANEL
ANION GAP: 7 (ref 5–15)
BUN: 17 mg/dL (ref 6–20)
CALCIUM: 8.5 mg/dL — AB (ref 8.9–10.3)
CO2: 25 mmol/L (ref 22–32)
CREATININE: 0.77 mg/dL (ref 0.61–1.24)
Chloride: 104 mmol/L (ref 101–111)
GFR calc Af Amer: >60 mL/min (ref >60)
GLUCOSE: 172 mg/dL — AB (ref 65–99)
Potassium: 3.3 mmol/L — ABNORMAL LOW (ref 3.5–5.1)
Sodium: 136 mmol/L (ref 135–145)

## 2015-08-26 LAB — GLUCOSE, CAPILLARY
GLUCOSE-CAPILLARY: 161 mg/dL — AB (ref 65–99)
GLUCOSE-CAPILLARY: 188 mg/dL — AB (ref 65–99)
Glucose-Capillary: 171 mg/dL — ABNORMAL HIGH (ref 65–99)
Glucose-Capillary: 210 mg/dL — ABNORMAL HIGH (ref 65–99)

## 2015-08-26 LAB — ECHOCARDIOGRAM COMPLETE
HEIGHTINCHES: 73 in
WEIGHTICAEL: 2667.2 [oz_av]

## 2015-08-26 LAB — MAGNESIUM: Magnesium: 2 mg/dL (ref 1.7–2.4)

## 2015-08-26 MED ORDER — POTASSIUM CHLORIDE CRYS ER 20 MEQ PO TBCR
40.0000 meq | EXTENDED_RELEASE_TABLET | Freq: Once | ORAL | Status: AC
  Administered 2015-08-26: 40 meq via ORAL
  Filled 2015-08-26: qty 2

## 2015-08-26 NOTE — Progress Notes (Addendum)
Patient reports feeling badly, could not really describe, BP low 95/51  Lowered HOB and called MD Bridgett Larsson, he will review meds to see if BP meds should be changed  O2 sats high 80s, reapplied O2 at 2 liters, some improvement in how patient felt

## 2015-08-26 NOTE — Progress Notes (Addendum)
[facility] [place] NAME: Ruben Gallegos    MR#:  [id number]  DATE OF BIRTH:  Jun 22, 1920  SUBJECTIVE:  CHIEF COMPLAINT:   Chief Complaint  Patient presents with  . Chest Pain    No cough and shortness of breath.  REVIEW OF SYSTEMS:  CONSTITUTIONAL: No fever, fatigue or weakness.  EYES: No blurred or double vision.  EARS, NOSE, AND THROAT: No tinnitus or ear pain.  RESPIRATORY: No cough, shortness of breath, no wheezing or hemoptysis.  CARDIOVASCULAR: No chest pain, orthopnea, has leg edema.  GASTROINTESTINAL: No nausea, vomiting, diarrhea or abdominal pain.  GENITOURINARY: No dysuria, hematuria.  ENDOCRINE: No polyuria, nocturia,  HEMATOLOGY: No anemia, easy bruising or bleeding SKIN: No rash or lesion. MUSCULOSKELETAL: No joint pain or arthritis.   NEUROLOGIC: No tingling, numbness, weakness.  PSYCHIATRY: No anxiety or depression.   DRUG ALLERGIES:   Allergies  Allergen Reactions  . Altace [Ramipril] Shortness Of Breath  . Tramadol Shortness Of Breath  . Actos [Pioglitazone] Swelling  . Codeine Nausea Only  . Demerol [Meperidine] Nausea Only  . Lipitor [Atorvastatin] Other (See Comments)  . Metformin And Related Other (See Comments)  . Mevacor [Lovastatin] Other (See Comments)    VITALS:  Blood pressure 94/51, pulse 70, temperature 97.6 F (36.4 C), temperature source Oral, resp. rate 19, height 6\' 1"  (1.854 m), weight 165 lb 9.6 oz (75.116 kg), SpO2 95 %.  PHYSICAL EXAMINATION:  GENERAL:  82 y.o.-year-old patient lying in the bed with no acute distress.  EYES: Pupils equal, round, reactive to light and accommodation. No scleral icterus. Extraocular muscles intact.  HEENT: Head atraumatic, normocephalic. Oropharynx and nasopharynx clear.  NECK:  Supple, no jugular venous distention. No thyroid enlargement, no tenderness.  LUNGS: Normal breath sounds bilaterally, no wheezing, but has mild rales, no rhonchi or  crepitation. No use of accessory muscles of respiration.  CARDIOVASCULAR: S1, S2 normal. No murmurs, rubs, or gallops.  ABDOMEN: Soft, nontender, nondistended. Bowel sounds present. No organomegaly or mass.  EXTREMITIES: No leg edema, no cyanosis, or clubbing.  NEUROLOGIC: Cranial nerves II through XII are intact. Muscle strength 5/5 in all extremities. Sensation intact. Gait not checked.  PSYCHIATRIC: The patient is alert and oriented x 3.  SKIN: No obvious rash, lesion, or ulcer.    LABORATORY PANEL:   CBC  Recent Labs Lab 08/25/15 0126  WBC 9.2  HGB 14.9  HCT 43.5  PLT 165   ------------------------------------------------------------------------------------------------------------------  Chemistries   Recent Labs Lab 08/26/15 0431  NA 136  K 3.3*  CL 104  CO2 25  GLUCOSE 172*  BUN 17  CREATININE 0.77  CALCIUM 8.5*  MG 2.0   ------------------------------------------------------------------------------------------------------------------  Cardiac Enzymes  Recent Labs Lab 08/25/15 1339  TROPONINI 0.06*   ------------------------------------------------------------------------------------------------------------------  RADIOLOGY:  Dg Chest 2 View  08/24/2015  CLINICAL DATA:  Chest pain shortness of breath 1 month. Symptoms worse today. EXAM: CHEST  2 VIEW COMPARISON:  03/02/2014 and CT 03/02/2014 FINDINGS: Lungs are somewhat hypoinflated with mild prominence the perihilar markings likely mild vascular congestion. Minimal linear density in the left base likely atelectasis or scarring. No evidence of effusion. Mild stable cardiomegaly. Calcified plaque over the thoracic aorta. There mild degenerate changes of the spine. IMPRESSION: Mild cardiomegaly with findings suggesting minimal vascular congestion. Electronically Signed   By: Marin Olp M.D.   On: 08/24/2015 20:25   Ct Angio Chest Pe W/cm &/or Wo Cm  08/24/2015  CLINICAL DATA:  Chest  pain and shortness of  breath for 1 month, symptoms worsened today. EXAM: CT ANGIOGRAPHY CHEST WITH CONTRAST TECHNIQUE: Multidetector CT imaging of the chest was performed using the standard protocol during bolus administration of intravenous contrast. Multiplanar CT image reconstructions and MIPs were obtained to evaluate the vascular anatomy. CONTRAST:  60 mL Isovue 370 IV COMPARISON:  Radiograph earlier this day. Chest CT PE protocol 03/02/2014. Additional prior CTs dating back to 2013. FINDINGS: Right upper lobe pulmonary artery filling defect on prior exam is no longer seen. Pulmonary arterial webs and sequela of chronic/prior embolus in the left lower lobe pulmonary arteries persist, have diminished in the right lower lobe from prior and near completely resolved. No new filling defects to suggest acute pulmonary embolus. Tortuous thoracic aorta with atherosclerosis. Limited assessment for dissection given phase of contrast,, however no findings to suggest aortic dissection. No pericardial effusion. Coronary artery calcifications versus stents. Multifocal mediastinal and bilateral hilar adenopathy. Prevascular/AP window lymph node measures 1.7 cm short axis unchanged. Pretracheal lymph node measures 10 mm, previously 13 mm left upper paratracheal lymph node measures 1.6 cm, increased. Left hilar node measures 1.3 cm, unchanged, right hilar node 1.3 cm mildly increased. Upper lobe dependent ground-glass opacities in smooth septal thickening, seen previously but progressed in the interim. Some of these ground-glass opacities appear consolidative/ nodule or, for example right upper lobe series 6, image 53. 4 mm left upper lobe nodule series 6, image 49 is unchanged dating back to 2013 and considered benign. Mild lower lobe bronchial thickening and atelectasis. Scattered fissural thickening bilaterally. No frank pleural effusion. Biapical pleural parenchymal scarring with pleural calcifications, left greater than right. No acute  abnormality in the included upper abdomen. Multilevel degenerative change throughout the spine with diffuse degenerative disc disease. There are no acute or suspicious osseous abnormalities. Review of the MIP images confirms the above findings. IMPRESSION: 1. No acute pulmonary embolus. Previous right upper lobe pulmonary embolus has resolved. Diminished webs in the lower lobe pulmonary arteries, persisting on the left consistent with chronic/prior thromboembolic disease. 2. Upper lobe ground-glass opacities in smooth septal thickening this can be seen with pulmonary edema, however was seen on prior exam to lesser extent. Inflammatory etiologies or chronic lung disease are also considered. Some of these areas appear confluent or nodular, and follow-up imaging may be warranted if the patient has no clinical signs of pulmonary edema. There is lower lobe bronchial thickening and atelectasis. 3. Stable biapical pleural parenchymal scarring. Stable 4 mm left upper lobe pulmonary nodule dating back 2013, benign. Electronically Signed   By: Jeb Levering M.D.   On: 08/24/2015 23:37    EKG:   Orders placed or performed during the hospital encounter of 08/24/15  . EKG 12-Lead  . EKG 12-Lead  . ED EKG  . ED EKG    ASSESSMENT AND PLAN:   Acute on chronic systolic CHF  Given IV Lasix given in the ED  Continue Lasix 40 mg daily, coreg,  Echocardiogram: EF 15%. F/u Dr. Ubaldo Glassing. Allergic to ACEI.  Hypokalemia. Give K supplement.  Elevated troponin. Due to CHF. On xarelto.   Diabetes ([place]) - sliding scale insulin with corresponding glucose checks and carb modified diet   HTN (hypertension). Bp is low side, hold imdur. Continue lasix and coreg if BP allows.   Hypothyroidism - home dose thyroid replacement   GERD (gastroesophageal reflux disease) - home dose PPI  History of PE and DVT. Continue xarelto.  Pt refused HHPT.  I Discussed with Dr.  Fath.  All the records are reviewed and case  discussed with Care Management/Social Workerr. Management plans discussed with the patient, his 2 sons and they are in agreement.  CODE STATUS: DO NOT RESUSCITATE  TOTAL TIME TAKING CARE OF THIS PATIENT: 35 minutes.  Greater than 50% time was spent on coordination of care and face-to-face counseling.  POSSIBLE D/C IN 1-2 DAYS, DEPENDING ON CLINICAL CONDITION.   Demetrios Loll M.D on 08/26/2015 at 2:58 PM  Between 7am to 6pm - Pager - [phone]  After 6pm go to [email or web address] - password [email or web address] [facility]  [facility]  Office  [phone]  CC: Primary care physician; [name], MD

## 2015-08-26 NOTE — Progress Notes (Signed)
Inpatient Diabetes Program Recommendations  AACE/ADA: New Consensus Statement on Inpatient Glycemic Control (2015)  Target Ranges:  Prepandial:   less than 140 mg/dL      Peak postprandial:   less than 180 mg/dL (1-2 hours)      Critically ill patients:  140 - 180 mg/dL   Review of Glycemic Control:  Results for JDEN, DURST (MRN [id number]) as of 08/26/2015 13:38  Ref. Range 08/25/2015 11:47 08/25/2015 16:16 08/25/2015 21:15 08/26/2015 07:48 08/26/2015 11:58  Glucose-Capillary Latest Ref Range: 65-99 mg/dL 137 (H) 231 (H) 192 (H) 171 (H) 210 (H)   Diabetes history: Type 2 Outpatient Diabetes medications: Lantus 26 units q HS, Glipizide 10 mg bid with meals Current orders for Inpatient glycemic control:  Novolog sensitive tid with meals and HS  Inpatient Diabetes Program Recommendations:     Please consider restarting 1/2 of patient's home dose of Lantus.  Consider Lantus 13 units daily.  Thanks Adah Perl, RN, BC-ADM Inpatient Diabetes Coordinator Pager [phone] (8a-5p)

## 2015-08-26 NOTE — Progress Notes (Signed)
1700 Coreg held d/t soft pressure and pulse in 70-80s, MD Bridgett Larsson aware

## 2015-08-26 NOTE — Progress Notes (Signed)
[facility]  SUBJECTIVE: intermitant sob   Filed Vitals:   08/25/15 1800 08/26/15 0521 08/26/15 1203 08/26/15 1624  BP:  101/58 94/51 107/80  Pulse:  81 70 78  Temp:  97.8 F (36.6 C) 97.6 F (36.4 C)   TempSrc:      Resp:  18 19   Height:      Weight:  75.116 kg (165 lb 9.6 oz)    SpO2: 91% 93% 95% 97%    Intake/Output Summary (Last 24 hours) at 08/26/15 1649 Last data filed at 08/26/15 1600  Gross per 24 hour  Intake    470 ml  Output   1400 ml  Net   -930 ml    LABS: Basic Metabolic Panel:  Recent Labs  08/25/15 0126 08/26/15 0431  NA 136 136  K 3.6 3.3*  CL 105 104  CO2 25 25  GLUCOSE 239* 172*  BUN 15 17  CREATININE 0.77 0.77  CALCIUM 8.5* 8.5*  MG  --  2.0   Liver Function Tests: No results for input(s): AST, ALT, ALKPHOS, BILITOT, PROT, ALBUMIN in the last 72 hours. No results for input(s): LIPASE, AMYLASE in the last 72 hours. CBC:  Recent Labs  08/24/15 1944 08/25/15 0126  WBC 11.4* 9.2  NEUTROABS 9.6*  --   HGB 14.6 14.9  HCT 43.3 43.5  MCV 97.9 96.3  PLT 164 165   Cardiac Enzymes:  Recent Labs  08/25/15 0126 08/25/15 0728 08/25/15 1339  TROPONINI 0.04* 0.06* 0.06*   BNP: Invalid input(s): POCBNP D-Dimer: No results for input(s): DDIMER in the last 72 hours. Hemoglobin A1C:  Recent Labs  08/25/15 0126  HGBA1C 8.7*   Fasting Lipid Panel: No results for input(s): CHOL, HDL, LDLCALC, TRIG, CHOLHDL, LDLDIRECT in the last 72 hours. Thyroid Function Tests: No results for input(s): TSH, T4TOTAL, T3FREE, THYROIDAB in the last 72 hours.  Invalid input(s): FREET3 Anemia Panel: No results for input(s): VITAMINB12, FOLATE, FERRITIN, TIBC, IRON, RETICCTPCT in the last 72 hours.   Physical Exam: Blood pressure 107/80, pulse 78, temperature 97.6 F (36.4 C), temperature source Oral, resp. rate 19, height 6\' 1"  (1.854 m), weight 75.116 kg (165 lb 9.6 oz), SpO2 97 %.   Wt Readings from Last 1  Encounters:  08/26/15 75.116 kg (165 lb 9.6 oz)     General appearance: alert and cooperative Head: Normocephalic, without obvious abnormality, atraumatic Resp: clear to auscultation bilaterally Cardio: irregularly irregular rhythm GI: soft, non-tender; bowel sounds normal; no masses,  no organomegaly Neurologic: Grossly normal  TELEMETRY: Reviewed telemetry pt in afib with variable vr  ASSESSMENT AND PLAN:  Principal Problem:   Acute on chronic systolic CHF (congestive heart failure) (HCC)-clinically improved with diuresis. On po lasix. EF is 15% which is unchanged from previous echos done several years ago. Will continue with carvedilol at low dose, lasix and defer ace i due to allergy Active Problems:   Diabetes (HCC)   HTN (hypertension)-pressor controlled at present   Hypothyroidism   GERD (gastroesophageal reflux disease)    Teodoro Spray., MD, [facility] 08/26/2015 4:49 PM

## 2015-08-26 NOTE — Progress Notes (Signed)
[facility]  SUBJECTIVE: less sob   Filed Vitals:   08/26/15 0521 08/26/15 1203 08/26/15 1624 08/26/15 1938  BP: 101/58 94/51 107/80 105/69  Pulse: 81 70 78 84  Temp: 97.8 F (36.6 C) 97.6 F (36.4 C)  97.9 F (36.6 C)  TempSrc:      Resp: 18 19  18   Height:      Weight: 75.116 kg (165 lb 9.6 oz)     SpO2: 93% 95% 97% 96%    Intake/Output Summary (Last 24 hours) at 08/26/15 2054 Last data filed at 08/26/15 1600  Gross per 24 hour  Intake      0 ml  Output    800 ml  Net   -800 ml    LABS: Basic Metabolic Panel:  Recent Labs  08/25/15 0126 08/26/15 0431  NA 136 136  K 3.6 3.3*  CL 105 104  CO2 25 25  GLUCOSE 239* 172*  BUN 15 17  CREATININE 0.77 0.77  CALCIUM 8.5* 8.5*  MG  --  2.0   Liver Function Tests: No results for input(s): AST, ALT, ALKPHOS, BILITOT, PROT, ALBUMIN in the last 72 hours. No results for input(s): LIPASE, AMYLASE in the last 72 hours. CBC:  Recent Labs  08/24/15 1944 08/25/15 0126  WBC 11.4* 9.2  NEUTROABS 9.6*  --   HGB 14.6 14.9  HCT 43.3 43.5  MCV 97.9 96.3  PLT 164 165   Cardiac Enzymes:  Recent Labs  08/25/15 0126 08/25/15 0728 08/25/15 1339  TROPONINI 0.04* 0.06* 0.06*   BNP: Invalid input(s): POCBNP D-Dimer: No results for input(s): DDIMER in the last 72 hours. Hemoglobin A1C:  Recent Labs  08/25/15 0126  HGBA1C 8.7*   Fasting Lipid Panel: No results for input(s): CHOL, HDL, LDLCALC, TRIG, CHOLHDL, LDLDIRECT in the last 72 hours. Thyroid Function Tests: No results for input(s): TSH, T4TOTAL, T3FREE, THYROIDAB in the last 72 hours.  Invalid input(s): FREET3 Anemia Panel: No results for input(s): VITAMINB12, FOLATE, FERRITIN, TIBC, IRON, RETICCTPCT in the last 72 hours.   Physical Exam: Blood pressure 105/69, pulse 84, temperature 97.9 F (36.6 C), temperature source Oral, resp. rate 18, height 6\' 1"  (1.854 m), weight 75.116 kg (165 lb 9.6 oz), SpO2 96 %.   Wt  Readings from Last 1 Encounters:  08/26/15 75.116 kg (165 lb 9.6 oz)     General appearance: alert and cooperative Resp: clear to auscultation bilaterally Cardio: regular rate and rhythm GI: soft, non-tender; bowel sounds normal; no masses,  no organomegaly Neurologic: Grossly normal  TELEMETRY: Reviewed telemetry pt in sinus rhythm with pacs:  ASSESSMENT AND PLAN:  Principal Problem:   Acute on chronic systolic CHF (congestive heart failure) (HCC)-Pt with know systollic chf with ef of 0000000 which is chronic. Improved with diuresis. Will continue with low doe carvedolol due to relative hypotension. Not candidate for ace i due to allegy. Careful diuesis. Will follow. No candidate for aicd.  Active Problems:   Diabetes ([place])   HTN (hypertension)   Hypothyroidism   GERD (gastroesophageal reflux disease)    Teodoro Spray., MD, [facility] 08/26/2015 8:54 PM

## 2015-08-26 NOTE — Progress Notes (Signed)
Family reminded to allow staff to empty patient's urinal so we can measure output

## 2015-08-26 NOTE — Clinical Social Work Note (Signed)
Clinical Social Work Assessment  Patient Details  Name: Ruben Gallegos MRN: [id number] Date of Birth: 08/18/1920  Date of referral:  08/26/15               Reason for consult:  Discharge Planning                Permission sought to share information with:  Family Supports Permission granted to share information::  Yes, Verbal Permission Granted  Name::        Agency::     Relationship::   Clare Gandy- Son)  Sport and exercise psychologist Information:     Housing/Transportation Living arrangements for the past 2 months:  Materials engineer ([place]) Source of Information:  Patient, Adult Children Patient Interpreter Needed:  None Criminal Activity/Legal Involvement Pertinent to Current Situation/Hospitalization:  No - Comment as needed Significant Relationships:  Adult Children, Other Family Members Lives with:  Facility Resident Do you feel safe going back to the place where you live?  Yes Need for family participation in patient care:  Yes (Comment) Clare Gandy- Son)  Care giving concerns:  Patient's family would like patient to go to [facility] at discharge.    Social Worker assessment / plan:  CSW was consulted by RN stating that patient and his son would like patient to go to [place] at discharge. Patient is a resident at [facility]. This is an extension of [facility]. CSW met with patient and his son at bedside. Per son patient has gone to [facility] several times for SNF admissions. Reported he's already spoke to the nurse at [facility] and they are expecting his referal. Reported that he feels that patient will benefit from [facility] since some of his medications have changed. Granted CSW verbal permission to contact [facility] admissions and send referral. CSW spoke to [employer] with [employer]. Per Maudie Mercury she's familiar with patient and his family. Reports that she will review referral and call CSW back. CSW awaiting phone call back.   FL2/ PASRR completed,  faxed to [facility]- admissions coordinator of [facility] via [place] and palced in MD [employer] for [employer].   Employment status:  Retired Forensic scientist:  Medicare PT Recommendations:  Not assessed at this time Information / Referral to community resources:  [place]  Patient/Family's Response to care:  Patient and his son are appreciative of CSW' assistance with SNF placement. Reported that care at [facility] has been good.   Patient/Family's Understanding of and Emotional Response to Diagnosis, Current Treatment, and Prognosis:  Reports that he understands why he's admitted into [facility].   Emotional Assessment Appearance:  Appears stated age Attitude/Demeanor/Rapport:   (None) Affect (typically observed):  Accepting, Calm, Pleasant Orientation:  Oriented to Self, Oriented to Place, Oriented to  Time, Oriented to Situation Alcohol / Substance use:  Not Applicable Psych involvement (Current and /or in the community):  No (Comment)  Discharge Needs  Concerns to be addressed:  Discharge Planning Concerns Readmission within the last 30 days:  No Current discharge risk:  Chronically ill Barriers to Discharge:  Continued Medical Work up   [employer], [place] 08/26/2015, 4:26 PM

## 2015-08-26 NOTE — Evaluation (Signed)
Physical Therapy Evaluation Patient Details Name: ROYDEN COLN MRN: [id number] DOB: [birth year] Today's Date: 08/26/2015   History of Present Illness  Turon Northcraft is a 82 y.o. male who presents with progressive shortness of breath. Patient has a known history of heart failure and states that he is felt like he has slowly been getting more dyspneic, he has had some orthopnea, and today reached a point that he he "felt like he was drowning." His BNP was elevated here in the ED, and chest x-ray was consistent with heart failure. He was given some IV Lasix with good diuresis and improvement in his symptoms. Hospitals were called for admission. Pt reports no falls in the last 12 months and family confirms. Family would like to see patient transfer and ambulate to ensure he will be safe at home.   Clinical Impression  Pt demonstrates baseline bed mobility, transfers, and ambulation per patient and family. He is able to transfer and ambulate with CGA only. He does have LE weakness but this is not new. SaO2 drops during ambulation but rebounds on room air within short time frame. Pt has CHF with low ejection fraction per patient and family. Family and pt would like for patient to return to independent living apartment as he has had no change in his mobility. Therapist believes this is completely reasonable. He could benefit from some [facility] PT for strengthening and balance but pt declines at this time. If pt returns home and feels unsafe he has resources at facility to move to higher level of care or utilize respite days. Pt will benefit from skilled PT services to address deficits in strength, balance, and mobility in order to return to full function at home.     Follow Up Recommendations Home health PT;Other (comment) (Pt declines HH PT)    Equipment Recommendations  None recommended by PT    Recommendations for Other Services       Precautions / Restrictions Precautions Precautions:  Fall Restrictions Weight Bearing Restrictions: No      Mobility  Bed Mobility Overal bed mobility: Independent             General bed mobility comments: Pt is able to perform supine<>sit without bed rails with HOB flat. He takes increased time but currently at baseline per family. No assist required by therapist  Transfers Overall transfer level: Needs assistance Equipment used: Rolling walker (2 wheeled) Transfers: Sit to/from Stand Sit to Stand: Min guard         General transfer comment: Pt takes increased time to come to standing due to decreased LE power but is able to come to standing without assistance from therapist. Once upright he is steady and standing with UE support on rolling walker  Ambulation/Gait Ambulation/Gait assistance: Min guard Ambulation Distance (Feet): 50 Feet Assistive device: Rolling walker (2 wheeled) Gait Pattern/deviations: Decreased step length - right;Decreased step length - left;Shuffle Gait velocity: Decreased Gait velocity interpretation: <1.8 ft/sec, indicative of risk for recurrent falls General Gait Details: Pt ambulates with short shuffling steps. Significant forward trunk lean with walker out in front of body. Pt provided cues to keep walker closer to body and for upright posture. Vitals monitored and HR remains WNL. SaO2 drops to 86% but recovers to 92% on room air within 15-20 seconds in standing. Pt does not require supplemental O2. Pt is able to maintain balance safely with use of rolling walker. Family reports that his gait is currently baseline  Stairs  Wheelchair Mobility    Modified Rankin (Stroke Patients Only)       Balance Overall balance assessment: Needs assistance Sitting-balance support: No upper extremity supported Sitting balance-Leahy Scale: Good     Standing balance support: Bilateral upper extremity supported Standing balance-Leahy Scale: Poor Standing balance comment: Pt requires UE  support for stability in standing. Further balance screening deferred as MD presents to evaluate patient                             Pertinent Vitals/Pain Pain Assessment: No/denies pain    Home Living Family/patient expects to be discharged to:: Other (Comment) (Independent living apartment at [facility])                 Additional Comments: Pt has [employer] aid who assist with dressing 7 days/wk, 2 times/day. They are available to stay with him during showers. Family is very involved with patient and his care. Has access to respite days at [facility] if needed. Pt uses rollator to ambulate in apartment and power chair for facility distance ambulation    Prior Function Level of Independence: Needs assistance   Gait / Transfers Assistance Needed: Rollator in apartment, electric chair for facility ambulation  ADL's / Homemaking Assistance Needed: Assist with dressing. Performs all bathing independently        Hand Dominance   Dominant Hand: Right    Extremity/Trunk Assessment   Upper Extremity Assessment: Overall WFL for tasks assessed           Lower Extremity Assessment: Overall WFL for tasks assessed         Communication   Communication: No difficulties  Cognition Arousal/Alertness: Awake/alert Behavior During Therapy: WFL for tasks assessed/performed Overall Cognitive Status: Within Functional Limits for tasks assessed                      General Comments      Exercises        Assessment/Plan    PT Assessment Patient needs continued PT services  PT Diagnosis Difficulty walking;Abnormality of gait;Generalized weakness   PT Problem List Decreased strength;Decreased activity tolerance;Decreased balance;Decreased mobility;Cardiopulmonary status limiting activity  PT Treatment Interventions DME instruction;Gait training;Functional mobility training;Balance training;Therapeutic activities;Therapeutic  exercise;Neuromuscular re-education;Patient/family education   PT Goals (Current goals can be found in the Care Plan section) Acute Rehab PT Goals Patient Stated Goal: Return to his independent living apartment PT Goal Formulation: With patient/family Time For Goal Achievement: 09/09/15 Potential to Achieve Goals: Good    Frequency Min 2X/week   Barriers to discharge        Co-evaluation               End of Session Equipment Utilized During Treatment: Gait belt Activity Tolerance: Patient tolerated treatment well Patient left: in bed;with call bell/phone within reach;with bed alarm set;with family/visitor present;Other (comment) (MD in room)           Time: [id number] PT Time Calculation (min) (ACUTE ONLY): 25 min   Charges:   PT Evaluation $PT Eval Moderate Complexity: 1 Procedure     PT G Codes:       Lyndel Safe Huprich PT, DPT   Huprich,Jason 08/26/2015, 12:00 PM

## 2015-08-26 NOTE — NC FL2 (Signed)
[place] LEVEL OF CARE SCREENING TOOL     IDENTIFICATION  Patient Name: Ruben Gallegos Birthdate: 07-07-1920 Sex: male Admission Date (Current Location): 08/24/2015  [place] and [place] Number:  Engineering geologist and Address:  [facility], [address] [place], [place] [address]      Provider Number: [id number]  Attending Physician Name and Address:  Demetrios Loll, MD  Relative Name and Phone Number:       Current Level of Care: Hospital Recommended Level of Care: [place] Prior Approval Number:    Date Approved/Denied:   PASRR Number:  ([id number])  Discharge Plan: SNF    Current Diagnoses: Patient Active Problem List   Diagnosis Date Noted  . Diabetes ([place]) 08/25/2015  . HTN (hypertension) 08/25/2015  . Hypothyroidism 08/25/2015  . Acute on chronic systolic CHF (congestive heart failure) ([place]) 08/25/2015  . GERD (gastroesophageal reflux disease) 08/25/2015    Orientation RESPIRATION BLADDER Height & Weight     Self, Time, Situation, Place  O2 (Nasal Cannula 2 (L/min)) Continent Weight: 165 lb 9.6 oz (75.116 kg) Height:  6\' 1"  (185.4 cm)  BEHAVIORAL SYMPTOMS/MOOD NEUROLOGICAL BOWEL NUTRITION STATUS   (None)  (None) Continent Diet (heart healthy/carb modified )  AMBULATORY STATUS COMMUNICATION OF NEEDS Skin   Extensive Assist Verbally Normal                       Personal Care Assistance Level of Assistance  Bathing, Feeding, Dressing Bathing Assistance: Limited assistance Feeding assistance: Independent Dressing Assistance: Limited assistance     Functional Limitations Info  Sight, Hearing, Speech Sight Info: Adequate Hearing Info: Adequate Speech Info: Adequate    SPECIAL CARE FACTORS FREQUENCY  PT (By licensed PT)     PT Frequency:  (5)              Contractures      Additional Factors Info  Code Status, Allergies, Isolation Precautions, Insulin Sliding Scale Code Status  Info:  (DNR) Allergies Info:  (Altace, Tramadol, Actos, Codeine, Demerol, Lipitor, Metformin And Related, Mevacor)   Insulin Sliding Scale Info:  (insulin aspart (novoLOG) injection 0-9 Units 0-9 Units, Subcutaneous, 3 times daily with meals; insulin aspart (novoLOG) injection 0-5 Units 0-5 Units, Subcutaneous, Daily at bedtime) Isolation Precautions Info:  (Contact precautions MRSA)     Current Medications (08/26/2015):  This is the current hospital active medication list Current Facility-Administered Medications  Medication Dose Route Frequency Provider Last Rate Last Dose  . acetaminophen (TYLENOL) tablet 650 mg  650 mg Oral Q6H PRN Lance Coon, MD       Or  . acetaminophen (TYLENOL) suppository 650 mg  650 mg Rectal Q6H PRN Lance Coon, MD      . carvedilol (COREG) tablet 3.125 mg  3.125 mg Oral BID WC Lance Coon, MD   3.125 mg at 08/26/15 0817  . Chlorhexidine Gluconate Cloth 2 % PADS 6 each  6 each Topical Q0600 Lance Coon, MD   6 each at 08/26/15 0535  . furosemide (LASIX) tablet 40 mg  40 mg Oral Daily Demetrios Loll, MD   40 mg at 08/26/15 1007  . insulin aspart (novoLOG) injection 0-5 Units  0-5 Units Subcutaneous QHS Lance Coon, MD   0 Units at 08/25/15 0130  . insulin aspart (novoLOG) injection 0-9 Units  0-9 Units Subcutaneous TID WC Lance Coon, MD   3 Units at 08/26/15 1241  . levothyroxine (SYNTHROID, LEVOTHROID) tablet 100 mcg  100  mcg Oral QAC breakfast Lance Coon, MD   100 mcg at 08/26/15 0817  . mupirocin ointment (BACTROBAN) 2 % 1 application  1 application Nasal BID Lance Coon, MD   1 application at [id number]  . ondansetron (ZOFRAN) tablet 4 mg  4 mg Oral Q6H PRN Lance Coon, MD       Or  . ondansetron [facility]) injection 4 mg  4 mg Intravenous Q6H PRN Lance Coon, MD      . pantoprazole (PROTONIX) EC tablet 40 mg  40 mg Oral QAC breakfast Lance Coon, MD   40 mg at 08/26/15 0817  . rivaroxaban (XARELTO) tablet 20 mg  20 mg Oral Q supper Lance Coon, MD    20 mg at 08/25/15 1813  . sodium chloride flush (NS) 0.9 % injection 3 mL  3 mL Intravenous Q12H Lance Coon, MD   3 mL at 08/26/15 1007     Discharge Medications: Please see discharge summary for a list of discharge medications.  Relevant Imaging Results:  Relevant Lab Results:   Additional Information  (SSN [id number])  Lorenso Quarry Leyan Branden, LCSW

## 2015-08-27 ENCOUNTER — Encounter
Admission: RE | Admit: 2015-08-27 | Discharge: 2015-08-27 | Disposition: A | Discharge status: Home / Self Care | Payer: Medicare Other | Source: Ambulatory Visit | Attending: Internal Medicine | Admitting: Internal Medicine

## 2015-08-27 DIAGNOSIS — E119 Type 2 diabetes mellitus without complications: Secondary | ICD-10-CM | POA: Insufficient documentation

## 2015-08-27 LAB — GLUCOSE, CAPILLARY
GLUCOSE-CAPILLARY: 194 mg/dL — AB (ref 65–99)
GLUCOSE-CAPILLARY: 328 mg/dL — AB (ref 65–99)
GLUCOSE-CAPILLARY: 107 mg/dL — AB (ref 65–99)

## 2015-08-27 MED ORDER — POTASSIUM CHLORIDE CRYS ER 20 MEQ PO TBCR
20.0000 meq | EXTENDED_RELEASE_TABLET | Freq: Every day | ORAL | Status: DC
  Administered 2015-08-27: 20 meq via ORAL
  Filled 2015-08-27: qty 1

## 2015-08-27 MED ORDER — FUROSEMIDE 40 MG PO TABS
40.0000 mg | ORAL_TABLET | Freq: Every day | ORAL | Status: DC
Start: 2015-08-27 — End: 2017-06-16

## 2015-08-27 NOTE — Progress Notes (Signed)
Removed PIV and telemetry.  No complaints of pain.  VSS, alert and oriented.  Family to transport to [place].

## 2015-08-27 NOTE — Discharge Instructions (Signed)
Heart healthy and ADA diet. Activity as tolerated. Fall precaution. Hold lasix and coreg if SBP<110 or DBP<60.

## 2015-08-27 NOTE — Clinical Social Work Placement (Signed)
   CLINICAL SOCIAL WORK PLACEMENT  NOTE  Date:  08/27/2015  Patient Details  Name: Ruben Gallegos MRN: [id number] Date of Birth: 1920-06-13  Clinical Social Work is seeking post-discharge placement for this patient at the [place] level of care (*CSW will initial, date and re-position this form in  chart as items are completed):  No   Patient/family provided with [place] Work Department's list of facilities offering this level of care within the geographic area requested by the patient (or if unable, by the patient's family).  No   Patient/family informed of their freedom to choose among providers that offer the needed level of care, that participate in Medicare, Medicaid or managed care program needed by the patient, have an available bed and are willing to accept the patient.  No   Patient/family informed of [facility]'s ownership interest in [facility] and [facility], as well as of the fact that they are under no obligation to receive care at these facilities.  PASRR submitted to EDS on       PASRR number received on       Existing PASRR number confirmed on 08/26/15     FL2 transmitted to all facilities in geographic area requested by pt/family on 08/26/15     FL2 transmitted to all facilities within larger geographic area on       Patient informed that his/her managed care company has contracts with or will negotiate with certain facilities, including the following:        Yes   Patient/family informed of bed offers received.  Patient chooses bed at  [facility])     Physician recommends and patient chooses bed at      Patient to be transferred to  [facility]) on 08/27/15.  Patient to be transferred to facility by  (Family)     Patient family notified on 08/27/15 of transfer.  Name of family member notified:   (Son)     PHYSICIAN       Additional Comment:    _______________________________________________ Baldemar Lenis, LCSW 08/27/2015, 10:55 AM

## 2015-08-27 NOTE — Care Management Important Message (Signed)
Important Message  Patient Details  Name: Ruben Gallegos MRN: [id number] Date of Birth: 03/19/1921   Medicare Important Message Given:  Yes    Jolly Mango, RN 08/27/2015, 11:34 AM

## 2015-08-27 NOTE — Progress Notes (Signed)
Clinical Social Worker was informed  that patient will be medically ready to discharge to [facility]. Patient and his family are in a agreement with plan. CSW called Maudie Mercury- Admissions Coordinator with [facility] to confirm that patient's bed is ready. Provided patient's room number 342 and number to call for report [phone]. All discharge information faxed to [facility] via [place]. DNR added to discharge packet.   RN will call report and patient will discharge to [facility] via patient's son Clare Gandy.  Ernest Pine, MSW, [place] Social Work Department [phone]

## 2015-08-27 NOTE — Discharge Summary (Signed)
[facility] NAME: Aniruddh Hartlieb    MR#:  [id number]  DATE OF BIRTH:  [birth year]  DATE OF ADMISSION:  08/24/2015 ADMITTING PHYSICIAN: Lance Coon, MD  DATE OF DISCHARGE: 08/27/2015 PRIMARY CARE PHYSICIAN: Tama High III, MD    ADMISSION DIAGNOSIS:  Shortness of breath [R06.02] Congestive heart failure, unspecified congestive heart failure chronicity, unspecified congestive heart failure type ([place]) [I50.9]   DISCHARGE DIAGNOSIS:  Acute on chronic systolic CHF, EF 0000000  SECONDARY DIAGNOSIS:   Past Medical History  Diagnosis Date  . CHF (congestive heart failure) ([place])   . Diabetes mellitus without complication ([place])   . Hypertension   . Cancer ([place])   . Hypothyroidism   . HLD (hyperlipidemia)   . GERD (gastroesophageal reflux disease)     HOSPITAL COURSE:  Acute on chronic systolic CHF, EF 0000000. Given IV Lasix given in the ED  Continue Lasix 40 mg daily, coreg per Dr. Ubaldo Glassing. Echocardiogram: EF 15%. Allergic to ACEI.  Hypokalemia. Given K supplement.  Elevated troponin. Due to CHF. On xarelto.   Diabetes ([place]) - sliding scale insulin with corresponding glucose checks and carb modified diet   HTN (hypertension). Bp is low side, hold imdur. Continue lasix and coreg if BP allows.   Hypothyroidism - home dose thyroid replacement    GERD (gastroesophageal reflux disease) - home dose PPI  History of PE and DVT. Continue xarelto.   DISCHARGE CONDITIONS:   Stable, discharge to SNF today.  CONSULTS OBTAINED:  Treatment Team:  Teodoro Spray, MD  DRUG ALLERGIES:   Allergies  Allergen Reactions  . Altace [Ramipril] Shortness Of Breath  . Tramadol Shortness Of Breath  . Actos [Pioglitazone] Swelling  . Codeine Nausea Only  . Demerol [Meperidine] Nausea Only  . Lipitor [Atorvastatin] Other (See Comments)  . Metformin And Related Other (See Comments)  . Mevacor [Lovastatin] Other (See Comments)     DISCHARGE MEDICATIONS:   Current Discharge Medication List    CONTINUE these medications which have CHANGED   Details  furosemide (LASIX) 40 MG tablet Take 1 tablet (40 mg total) by mouth daily. Qty: 30 tablet      CONTINUE these medications which have NOT CHANGED   Details  carvedilol (COREG) 3.125 MG tablet Take 3.125 mg by mouth 2 (two) times daily.    esomeprazole (NEXIUM) 40 MG capsule Take 40 mg by mouth daily.    glipiZIDE (GLUCOTROL) 10 MG tablet Take 10 mg by mouth 2 (two) times daily.    insulin glargine (LANTUS) 100 UNIT/ML injection Inject 26 Units into the skin at bedtime.    levothyroxine (SYNTHROID, LEVOTHROID) 100 MCG tablet Take 100 mcg by mouth daily.    mirtazapine (REMERON) 7.5 MG tablet Take 7.5 mg by mouth daily.    nitroGLYCERIN (NITROSTAT) 0.4 MG SL tablet Place 0.4 mg under the tongue every 5 (five) minutes as needed for chest pain.    potassium chloride (K-DUR) 10 MEQ tablet Take 10 mEq by mouth daily.    XARELTO 20 MG TABS tablet Take 20 mg by mouth daily.      STOP taking these medications     isosorbide mononitrate (IMDUR) 30 MG 24 hr tablet          DISCHARGE INSTRUCTIONS:    If you experience worsening of your admission symptoms, develop shortness of breath, life threatening emergency, suicidal or homicidal thoughts you must seek medical attention immediately by calling 911 or calling your MD  immediately  if symptoms less severe.  You Must read complete instructions/literature along with all the possible adverse reactions/side effects for all the Medicines you take and that have been prescribed to you. Take any new Medicines after you have completely understood and accept all the possible adverse reactions/side effects.   Please note  You were cared for by a hospitalist during your hospital stay. If you have any questions about your discharge medications or the care you received while you were in the hospital after you are  discharged, you can call the unit and asked to speak with the hospitalist on call if the hospitalist that took care of you is not available. Once you are discharged, your primary care physician will handle any further medical issues. Please note that NO REFILLS for any discharge medications will be authorized once you are discharged, as it is imperative that you return to your primary care physician (or establish a relationship with a primary care physician if you do not have one) for your aftercare needs so that they can reassess your need for medications and monitor your lab values.    Today   SUBJECTIVE    No complaint.  VITAL SIGNS:  Blood pressure 112/84, pulse 80, temperature 97.6 F (36.4 C), temperature source Oral, resp. rate 16, height 6\' 1"  (1.854 m), weight 168 lb 12.8 oz (76.567 kg), SpO2 95 %.  I/O:   Intake/Output Summary (Last 24 hours) at 08/27/15 0945 Last data filed at 08/27/15 0749  Gross per 24 hour  Intake      0 ml  Output    700 ml  Net   -700 ml    PHYSICAL EXAMINATION:  GENERAL:  82 y.o.-year-old patient lying in the bed with no acute distress.  EYES: Pupils equal, round, reactive to light and accommodation. No scleral icterus. Extraocular muscles intact.  HEENT: Head atraumatic, normocephalic. Oropharynx and nasopharynx clear.  NECK:  Supple, no jugular venous distention. No thyroid enlargement, no tenderness.  LUNGS: Normal breath sounds bilaterally, no wheezing, rales,rhonchi or crepitation. No use of accessory muscles of respiration.  CARDIOVASCULAR: S1, S2 normal. No murmurs, rubs, or gallops.  ABDOMEN: Soft, non-tender, non-distended. Bowel sounds present. No organomegaly or mass.  EXTREMITIES: No pedal edema, cyanosis, or clubbing.  NEUROLOGIC: Cranial nerves II through XII are intact. Muscle strength 5/5 in all extremities. Sensation intact. Gait not checked.  PSYCHIATRIC: The patient is alert and oriented x 3.  SKIN: No obvious rash, lesion, or  ulcer.   DATA REVIEW:   CBC  Recent Labs Lab 08/25/15 0126  WBC 9.2  HGB 14.9  HCT 43.5  PLT 165    Chemistries   Recent Labs Lab 08/26/15 0431  NA 136  K 3.3*  CL 104  CO2 25  GLUCOSE 172*  BUN 17  CREATININE 0.77  CALCIUM 8.5*  MG 2.0    Cardiac Enzymes  Recent Labs Lab 08/25/15 1339  TROPONINI 0.06*    Microbiology Results  Results for orders placed or performed during the hospital encounter of 08/24/15  MRSA PCR Screening     Status: Abnormal   Collection Time: 08/25/15  1:34 AM  Result Value Ref Range Status   MRSA by PCR POSITIVE (A) NEGATIVE Final    Comment: CRITICAL RESULT CALLED TO, READ BACK BY AND VERIFIED WITH: MARCELLA TURNER ON 08/25/15 AT 0335 BY TLB The GeneXpert MRSA Assay (FDA approved for NASAL specimens only), is one component of a comprehensive MRSA colonization surveillance program. It is not  intended to diagnose MRSA infection nor to guide or monitor treatment for MRSA infections.     RADIOLOGY:  No results found.      Management plans discussed with the patient, family and they are in agreement.  CODE STATUS:     Code Status Orders        Start     Ordered   08/25/15 0117  Do not attempt resuscitation (DNR)   Continuous    Question Answer Comment  In the event of cardiac or respiratory ARREST Do not call a "code blue"   In the event of cardiac or respiratory ARREST Do not perform Intubation, CPR, defibrillation or ACLS   In the event of cardiac or respiratory ARREST Use medication by any route, position, wound care, and other measures to relive pain and suffering. May use oxygen, suction and manual treatment of airway obstruction as needed for comfort.      08/25/15 0116    Code Status History    Date Active Date Inactive Code Status Order ID Comments User Context   This patient has a current code status but no historical code status.    Advance Directive Documentation        Most Recent Value   Type of  Advance Directive  Out of facility DNR (pink MOST or yellow form)   Pre-existing out of facility DNR order (yellow form or pink MOST form)  Yellow form placed in chart (order not valid for inpatient use)   "MOST" Form in Place?        TOTAL TIME TAKING CARE OF THIS PATIENT: 32 minutes.    Demetrios Loll M.D on 08/27/2015 at 9:45 AM  Between 7am to 6pm - Pager - [phone]  After 6pm go to [email or web address] - password [email or web address] [facility]  [facility] [facility]  Office  [phone]  CC: Primary care physician; [name], MD

## 2015-08-28 LAB — GLUCOSE, CAPILLARY
GLUCOSE-CAPILLARY: 142 mg/dL — AB (ref 65–99)
Glucose-Capillary: 276 mg/dL — ABNORMAL HIGH (ref 65–99)

## 2015-08-29 LAB — GLUCOSE, CAPILLARY
Glucose-Capillary: 126 mg/dL — ABNORMAL HIGH (ref 65–99)
Glucose-Capillary: 258 mg/dL — ABNORMAL HIGH (ref 65–99)

## 2015-08-30 LAB — GLUCOSE, CAPILLARY
GLUCOSE-CAPILLARY: 305 mg/dL — AB (ref 65–99)
GLUCOSE-CAPILLARY: 209 mg/dL — AB (ref 65–99)
Glucose-Capillary: 101 mg/dL — ABNORMAL HIGH (ref 65–99)

## 2015-08-31 LAB — GLUCOSE, CAPILLARY
GLUCOSE-CAPILLARY: 58 mg/dL — AB (ref 65–99)
GLUCOSE-CAPILLARY: 64 mg/dL — AB (ref 65–99)
GLUCOSE-CAPILLARY: 217 mg/dL — AB (ref 65–99)
Glucose-Capillary: 129 mg/dL — ABNORMAL HIGH (ref 65–99)
Glucose-Capillary: 270 mg/dL — ABNORMAL HIGH (ref 65–99)

## 2015-09-01 LAB — GLUCOSE, CAPILLARY
GLUCOSE-CAPILLARY: 78 mg/dL (ref 65–99)
GLUCOSE-CAPILLARY: 222 mg/dL — AB (ref 65–99)
GLUCOSE-CAPILLARY: 212 mg/dL — AB (ref 65–99)
Glucose-Capillary: 58 mg/dL — ABNORMAL LOW (ref 65–99)
Glucose-Capillary: 95 mg/dL (ref 65–99)

## 2015-09-01 LAB — BASIC METABOLIC PANEL
ANION GAP: 7 (ref 5–15)
BUN: 14 mg/dL (ref 6–20)
CO2: 28 mmol/L (ref 22–32)
Calcium: 8.9 mg/dL (ref 8.9–10.3)
Chloride: 102 mmol/L (ref 101–111)
Creatinine, Ser: 0.94 mg/dL (ref 0.61–1.24)
GFR calc non Af Amer: >60 mL/min (ref >60)
GLUCOSE: 188 mg/dL — AB (ref 65–99)
Potassium: 3.9 mmol/L (ref 3.5–5.1)
SODIUM: 137 mmol/L (ref 135–145)

## 2015-09-02 LAB — GLUCOSE, CAPILLARY
GLUCOSE-CAPILLARY: 132 mg/dL — AB (ref 65–99)
GLUCOSE-CAPILLARY: 151 mg/dL — AB (ref 65–99)

## 2015-09-03 ENCOUNTER — Encounter
Admission: RE | Admit: 2015-09-03 | Discharge: 2015-09-03 | Disposition: A | Discharge status: Home / Self Care | Payer: Medicare Other | Source: Ambulatory Visit | Attending: Internal Medicine | Admitting: Internal Medicine

## 2015-09-03 DIAGNOSIS — E119 Type 2 diabetes mellitus without complications: Secondary | ICD-10-CM | POA: Diagnosis not present

## 2015-09-03 LAB — GLUCOSE, CAPILLARY
GLUCOSE-CAPILLARY: 263 mg/dL — AB (ref 65–99)
Glucose-Capillary: 90 mg/dL (ref 65–99)

## 2015-09-04 DIAGNOSIS — E119 Type 2 diabetes mellitus without complications: Secondary | ICD-10-CM | POA: Diagnosis not present

## 2015-09-04 LAB — GLUCOSE, CAPILLARY
GLUCOSE-CAPILLARY: 254 mg/dL — AB (ref 65–99)
Glucose-Capillary: 129 mg/dL — ABNORMAL HIGH (ref 65–99)
Glucose-Capillary: 186 mg/dL — ABNORMAL HIGH (ref 65–99)

## 2015-09-05 DIAGNOSIS — E119 Type 2 diabetes mellitus without complications: Secondary | ICD-10-CM | POA: Diagnosis not present

## 2015-09-05 LAB — GLUCOSE, CAPILLARY
GLUCOSE-CAPILLARY: 244 mg/dL — AB (ref 65–99)
Glucose-Capillary: 76 mg/dL (ref 65–99)
Glucose-Capillary: 225 mg/dL — ABNORMAL HIGH (ref 65–99)

## 2015-09-06 DIAGNOSIS — E119 Type 2 diabetes mellitus without complications: Secondary | ICD-10-CM | POA: Diagnosis not present

## 2015-09-06 LAB — GLUCOSE, CAPILLARY
GLUCOSE-CAPILLARY: 119 mg/dL — AB (ref 65–99)
GLUCOSE-CAPILLARY: 232 mg/dL — AB (ref 65–99)
Glucose-Capillary: 73 mg/dL (ref 65–99)
Glucose-Capillary: 268 mg/dL — ABNORMAL HIGH (ref 65–99)

## 2015-09-07 DIAGNOSIS — E119 Type 2 diabetes mellitus without complications: Secondary | ICD-10-CM | POA: Diagnosis not present

## 2015-09-07 LAB — GLUCOSE, CAPILLARY
Glucose-Capillary: 93 mg/dL (ref 65–99)
Glucose-Capillary: 256 mg/dL — ABNORMAL HIGH (ref 65–99)

## 2015-09-08 DIAGNOSIS — E119 Type 2 diabetes mellitus without complications: Secondary | ICD-10-CM | POA: Diagnosis not present

## 2015-09-08 LAB — GLUCOSE, CAPILLARY
GLUCOSE-CAPILLARY: 216 mg/dL — AB (ref 65–99)
GLUCOSE-CAPILLARY: 186 mg/dL — AB (ref 65–99)
Glucose-Capillary: 78 mg/dL (ref 65–99)
Glucose-Capillary: 156 mg/dL — ABNORMAL HIGH (ref 65–99)

## 2015-09-09 DIAGNOSIS — E119 Type 2 diabetes mellitus without complications: Secondary | ICD-10-CM | POA: Diagnosis not present

## 2015-09-09 LAB — GLUCOSE, CAPILLARY
GLUCOSE-CAPILLARY: 156 mg/dL — AB (ref 65–99)
Glucose-Capillary: 125 mg/dL — ABNORMAL HIGH (ref 65–99)
Glucose-Capillary: 245 mg/dL — ABNORMAL HIGH (ref 65–99)

## 2015-10-03 ENCOUNTER — Encounter
Admission: RE | Admit: 2015-10-03 | Discharge: 2015-10-03 | Disposition: A | Discharge status: Home / Self Care | Payer: Medicare Other | Source: Ambulatory Visit | Attending: Internal Medicine | Admitting: Internal Medicine

## 2015-10-03 DIAGNOSIS — E119 Type 2 diabetes mellitus without complications: Secondary | ICD-10-CM | POA: Diagnosis present

## 2015-10-03 DIAGNOSIS — I5022 Chronic systolic (congestive) heart failure: Secondary | ICD-10-CM | POA: Diagnosis not present

## 2015-10-03 LAB — GLUCOSE, CAPILLARY: Glucose-Capillary: 238 mg/dL — ABNORMAL HIGH (ref 65–99)

## 2015-10-15 ENCOUNTER — Non-Acute Institutional Stay (SKILLED_NURSING_FACILITY): Payer: Medicare Other | Admitting: Gerontology

## 2015-10-15 DIAGNOSIS — E785 Hyperlipidemia, unspecified: Secondary | ICD-10-CM | POA: Insufficient documentation

## 2015-10-15 DIAGNOSIS — R600 Localized edema: Secondary | ICD-10-CM | POA: Diagnosis not present

## 2015-10-15 DIAGNOSIS — L719 Rosacea, unspecified: Secondary | ICD-10-CM | POA: Insufficient documentation

## 2015-10-15 NOTE — Progress Notes (Signed)
Location:  [facility] Service:  [facility] [phone]) Provider:  Toni Arthurs, NP-C  [place] III, MD  Patient Care Team: [name], MD as PCP - General (Internal Medicine)  Extended Emergency Contact Information Primary Emergency Contact: Sevey,Ted Address: [address], [place] [address] Johnnette Litter of [place] Phone: [phone] Relation: Son Secondary Emergency Contact: Patrica Duel Address: [address]          [place], [place] [address] Johnnette Litter of [place] Phone: [phone] Work Phone: [phone] Mobile Phone: [phone] Relation: Son  Code Status:  DNR Goals of care: Advanced Directive information Advanced Directives 08/25/2015  Does patient have an advance directive? Yes  Type of Advance Directive Out of facility DNR (pink MOST or yellow form)  Copy of advanced directive(s) in chart? Yes  Pre-existing out of facility DNR order (yellow form or pink MOST form) Yellow form placed in chart (order not valid for inpatient use)     Chief Complaint  Patient presents with  . Medical Management of Chronic Issues    HPI:  Pt is a 82 y.o. male seen today for medical management of chronic diseases. He is a resident of [facility]isted [facility]. He was a resident of [facility] until he was no longer able to safely care for himself. He was moved to [place]. However, he still hopes to go home. He reports he is feeling good. No pain. No dyspnea. No chest pain. Does c/o chronic edema of the Left lower leg. Pt mobilizes in motorized scooter.      Past Medical History  Diagnosis Date  . CHF (congestive heart failure) ([place])   . Diabetes mellitus without complication ([place])   . Hypertension   . Cancer ([place])   . Hypothyroidism   . HLD (hyperlipidemia)   . GERD (gastroesophageal reflux disease)    Past Surgical History  Procedure Laterality Date  . Appendectomy    . Cataract extraction    . Hernia repair       Allergies  Allergen Reactions  . Altace [Ramipril] Shortness Of Breath  . Tramadol Shortness Of Breath  . Actos [Pioglitazone] Swelling  . Codeine Nausea Only  . Demerol [Meperidine] Nausea Only  . Lipitor [Atorvastatin] Other (See Comments)  . Metformin And Related Other (See Comments)  . Mevacor [Lovastatin] Other (See Comments)      Medication List       This list is accurate as of: 10/15/15  5:17 PM.  Always use your most recent med list.               carvedilol 3.125 MG tablet  Commonly known as:  COREG  Take 3.125 mg by mouth 2 (two) times daily.     esomeprazole 40 MG capsule  Commonly known as:  NEXIUM  Take 40 mg by mouth daily.     furosemide 40 MG tablet  Commonly known as:  LASIX  Take 1 tablet (40 mg total) by mouth daily.     glipiZIDE 10 MG tablet  Commonly known as:  GLUCOTROL  Take 10 mg by mouth 2 (two) times daily.     insulin glargine 100 UNIT/ML injection  Commonly known as:  LANTUS  Inject 26 Units into the skin at bedtime.     levothyroxine 100 MCG tablet  Commonly known as:  SYNTHROID, LEVOTHROID  Take 100 mcg by mouth daily.  mirtazapine 7.5 MG tablet  Commonly known as:  REMERON  Take 7.5 mg by mouth daily.     nitroGLYCERIN 0.4 MG SL tablet  Commonly known as:  NITROSTAT  Place 0.4 mg under the tongue every 5 (five) minutes as needed for chest pain.     potassium chloride 10 MEQ tablet  Commonly known as:  K-DUR  Take 10 mEq by mouth daily.     XARELTO 20 MG Tabs tablet  Generic drug:  rivaroxaban  Take 20 mg by mouth daily.        Review of Systems  Constitutional: Negative.   HENT: Negative.   Respiratory: Negative for cough, choking, chest tightness and shortness of breath.   Cardiovascular: Positive for leg swelling. Negative for chest pain and palpitations.  Gastrointestinal: Negative.   Genitourinary: Negative.   Musculoskeletal: Positive for gait problem.  Skin: Negative.   Neurological: Negative.    Psychiatric/Behavioral: Negative.   All other systems reviewed and are negative.    There is no immunization history on file for this patient. There are no preventive care reminders to display for this patient. No flowsheet data found. Functional Status Survey:    Filed Vitals:   10/15/15 1715  BP: 94/59  Pulse: 84  Resp: 18   There is no weight on file to calculate BMI. Physical Exam  Constitutional: He is oriented to person, place, and time. He appears well-developed and well-nourished. No distress.  HENT:  Head: Normocephalic and atraumatic.  Eyes: Conjunctivae and EOM are normal. Pupils are equal, round, and reactive to light.  Neck: Normal range of motion. Neck supple. No JVD present.  Cardiovascular: Normal rate, regular rhythm, normal heart sounds and intact distal pulses.  Exam reveals no gallop and no friction rub.   No murmur heard. Pulmonary/Chest: Effort normal and breath sounds normal. No respiratory distress. He has no wheezes. He has no rales. He exhibits no tenderness.  Abdominal: Soft. Bowel sounds are normal. He exhibits no distension. There is no tenderness. There is no guarding.  Musculoskeletal:  Generalized weakness  Neurological: He is alert and oriented to person, place, and time. He is not disoriented. He displays atrophy.  Skin: Skin is warm and dry. No rash noted. He is not diaphoretic. No cyanosis or erythema. No pallor. Nails show no clubbing.  Psychiatric: He has a normal mood and affect. His behavior is normal. Judgment and thought content normal.  Nursing note and vitals reviewed.   Labs reviewed:  Recent Labs  08/25/15 0126 08/26/15 0431 09/01/15 0944  NA 136 136 137  K 3.6 3.3* 3.9  CL 105 104 102  CO2 25 25 28   GLUCOSE 239* 172* 188*  BUN 15 17 14   CREATININE 0.77 0.77 0.94  CALCIUM 8.5* 8.5* 8.9  MG  --  2.0  --    No results for input(s): AST, ALT, ALKPHOS, BILITOT, PROT, ALBUMIN in the last 8760 hours.  Recent Labs   08/24/15 1944 08/25/15 0126  WBC 11.4* 9.2  NEUTROABS 9.6*  --   HGB 14.6 14.9  HCT 43.3 43.5  MCV 97.9 96.3  PLT 164 165   Lab Results  Component Value Date   TSH 29.8* 01/28/2014   Lab Results  Component Value Date   HGBA1C 8.7* 08/25/2015   No results found for: CHOL, HDL, LDLCALC, LDLDIRECT, TRIG, CHOLHDL  Significant Diagnostic Results in last 30 days:  No results found.  Assessment/Plan 1. Edema of left lower extremity  Spironolactone 25 mg 1/2 tablet  daily  DC potassium  Check Met b in one week  Elevated legs when at rest   Family/ staff Communication:   Total Time: 35 minutes  Documentation: 15 minutes  Face to Face: 20 minutes  Family/Phone:   Labs/tests ordered:  Met B in 1 week   Vikki Ports, NP-C [facility] [address] [place], [place] [address] Cell Phone (Mon-Fri 8am-5pm):  [phone] On Call:  [phone] & follow prompts after 5pm & weekends Office Phone:  [phone] Office Fax:  [phone]

## 2015-10-22 DIAGNOSIS — E119 Type 2 diabetes mellitus without complications: Secondary | ICD-10-CM | POA: Diagnosis not present

## 2015-10-22 LAB — BASIC METABOLIC PANEL
Anion gap: 8 (ref 5–15)
BUN: 19 mg/dL (ref 6–20)
CHLORIDE: 101 mmol/L (ref 101–111)
CO2: 23 mmol/L (ref 22–32)
Calcium: 8.9 mg/dL (ref 8.9–10.3)
Creatinine, Ser: 0.92 mg/dL (ref 0.61–1.24)
GFR calc Af Amer: >60 mL/min (ref >60)
Glucose, Bld: 336 mg/dL — ABNORMAL HIGH (ref 65–99)
POTASSIUM: 3.9 mmol/L (ref 3.5–5.1)
Sodium: 132 mmol/L — ABNORMAL LOW (ref 135–145)

## 2015-10-25 DIAGNOSIS — E119 Type 2 diabetes mellitus without complications: Secondary | ICD-10-CM | POA: Diagnosis not present

## 2015-10-25 LAB — GLUCOSE, CAPILLARY: Glucose-Capillary: 200 mg/dL — ABNORMAL HIGH (ref 65–99)

## 2015-10-26 DIAGNOSIS — E119 Type 2 diabetes mellitus without complications: Secondary | ICD-10-CM | POA: Diagnosis not present

## 2015-10-26 LAB — GLUCOSE, CAPILLARY: Glucose-Capillary: 119 mg/dL — ABNORMAL HIGH (ref 65–99)

## 2015-10-27 DIAGNOSIS — E119 Type 2 diabetes mellitus without complications: Secondary | ICD-10-CM | POA: Diagnosis not present

## 2015-10-27 LAB — GLUCOSE, CAPILLARY: GLUCOSE-CAPILLARY: 149 mg/dL — AB (ref 65–99)

## 2015-10-28 DIAGNOSIS — E119 Type 2 diabetes mellitus without complications: Secondary | ICD-10-CM | POA: Diagnosis not present

## 2015-10-28 LAB — GLUCOSE, CAPILLARY: GLUCOSE-CAPILLARY: 149 mg/dL — AB (ref 65–99)

## 2015-10-29 DIAGNOSIS — E119 Type 2 diabetes mellitus without complications: Secondary | ICD-10-CM | POA: Diagnosis not present

## 2015-10-29 LAB — GLUCOSE, CAPILLARY: Glucose-Capillary: 107 mg/dL — ABNORMAL HIGH (ref 65–99)

## 2015-10-30 DIAGNOSIS — E119 Type 2 diabetes mellitus without complications: Secondary | ICD-10-CM | POA: Diagnosis not present

## 2015-10-30 LAB — GLUCOSE, CAPILLARY
GLUCOSE-CAPILLARY: 271 mg/dL — AB (ref 65–99)
Glucose-Capillary: 147 mg/dL — ABNORMAL HIGH (ref 65–99)

## 2015-10-31 DIAGNOSIS — E119 Type 2 diabetes mellitus without complications: Secondary | ICD-10-CM | POA: Diagnosis not present

## 2015-10-31 LAB — GLUCOSE, CAPILLARY: Glucose-Capillary: 214 mg/dL — ABNORMAL HIGH (ref 65–99)

## 2015-11-03 ENCOUNTER — Encounter
Admission: RE | Admit: 2015-11-03 | Discharge: 2015-11-03 | Disposition: A | Discharge status: Home / Self Care | Payer: Medicare Other | Source: Ambulatory Visit | Attending: Internal Medicine | Admitting: Internal Medicine

## 2015-11-03 DIAGNOSIS — E119 Type 2 diabetes mellitus without complications: Secondary | ICD-10-CM | POA: Insufficient documentation

## 2015-11-04 DIAGNOSIS — E119 Type 2 diabetes mellitus without complications: Secondary | ICD-10-CM | POA: Diagnosis not present

## 2015-11-04 LAB — GLUCOSE, CAPILLARY: GLUCOSE-CAPILLARY: 265 mg/dL — AB (ref 65–99)

## 2015-11-05 DIAGNOSIS — E119 Type 2 diabetes mellitus without complications: Secondary | ICD-10-CM | POA: Diagnosis not present

## 2015-11-05 LAB — GLUCOSE, CAPILLARY
GLUCOSE-CAPILLARY: 385 mg/dL — AB (ref 65–99)
Glucose-Capillary: 383 mg/dL — ABNORMAL HIGH (ref 65–99)
Glucose-Capillary: 223 mg/dL — ABNORMAL HIGH (ref 65–99)

## 2015-11-06 DIAGNOSIS — E119 Type 2 diabetes mellitus without complications: Secondary | ICD-10-CM | POA: Diagnosis not present

## 2015-11-06 LAB — GLUCOSE, CAPILLARY: Glucose-Capillary: 206 mg/dL — ABNORMAL HIGH (ref 65–99)

## 2015-11-07 DIAGNOSIS — E119 Type 2 diabetes mellitus without complications: Secondary | ICD-10-CM | POA: Diagnosis not present

## 2015-11-07 LAB — GLUCOSE, CAPILLARY: GLUCOSE-CAPILLARY: 274 mg/dL — AB (ref 65–99)

## 2015-11-08 DIAGNOSIS — E119 Type 2 diabetes mellitus without complications: Secondary | ICD-10-CM | POA: Diagnosis not present

## 2015-11-08 LAB — GLUCOSE, CAPILLARY
Glucose-Capillary: 197 mg/dL — ABNORMAL HIGH (ref 65–99)
Glucose-Capillary: 272 mg/dL — ABNORMAL HIGH (ref 65–99)

## 2015-11-09 DIAGNOSIS — E119 Type 2 diabetes mellitus without complications: Secondary | ICD-10-CM | POA: Diagnosis not present

## 2015-11-09 LAB — GLUCOSE, CAPILLARY: GLUCOSE-CAPILLARY: 177 mg/dL — AB (ref 65–99)

## 2015-11-10 DIAGNOSIS — E119 Type 2 diabetes mellitus without complications: Secondary | ICD-10-CM | POA: Diagnosis not present

## 2015-11-10 LAB — GLUCOSE, CAPILLARY: GLUCOSE-CAPILLARY: 229 mg/dL — AB (ref 65–99)

## 2015-11-11 DIAGNOSIS — E119 Type 2 diabetes mellitus without complications: Secondary | ICD-10-CM | POA: Diagnosis not present

## 2015-11-11 LAB — GLUCOSE, CAPILLARY: Glucose-Capillary: 260 mg/dL — ABNORMAL HIGH (ref 65–99)

## 2015-11-12 DIAGNOSIS — E119 Type 2 diabetes mellitus without complications: Secondary | ICD-10-CM | POA: Diagnosis not present

## 2015-11-12 LAB — GLUCOSE, CAPILLARY: Glucose-Capillary: 235 mg/dL — ABNORMAL HIGH (ref 65–99)

## 2015-11-13 DIAGNOSIS — E119 Type 2 diabetes mellitus without complications: Secondary | ICD-10-CM | POA: Diagnosis not present

## 2015-11-13 LAB — GLUCOSE, CAPILLARY: Glucose-Capillary: 211 mg/dL — ABNORMAL HIGH (ref 65–99)

## 2015-11-14 DIAGNOSIS — E119 Type 2 diabetes mellitus without complications: Secondary | ICD-10-CM | POA: Diagnosis not present

## 2015-11-14 LAB — GLUCOSE, CAPILLARY: Glucose-Capillary: 245 mg/dL — ABNORMAL HIGH (ref 65–99)

## 2015-11-17 DIAGNOSIS — E119 Type 2 diabetes mellitus without complications: Secondary | ICD-10-CM | POA: Diagnosis not present

## 2015-11-17 LAB — GLUCOSE, CAPILLARY: GLUCOSE-CAPILLARY: 169 mg/dL — AB (ref 65–99)

## 2015-11-18 DIAGNOSIS — E119 Type 2 diabetes mellitus without complications: Secondary | ICD-10-CM | POA: Diagnosis not present

## 2015-11-18 LAB — GLUCOSE, CAPILLARY: Glucose-Capillary: 205 mg/dL — ABNORMAL HIGH (ref 65–99)

## 2015-11-19 DIAGNOSIS — E119 Type 2 diabetes mellitus without complications: Secondary | ICD-10-CM | POA: Diagnosis not present

## 2015-11-19 LAB — GLUCOSE, CAPILLARY: Glucose-Capillary: 202 mg/dL — ABNORMAL HIGH (ref 65–99)

## 2015-11-20 DIAGNOSIS — E119 Type 2 diabetes mellitus without complications: Secondary | ICD-10-CM | POA: Diagnosis not present

## 2015-11-20 LAB — GLUCOSE, CAPILLARY: Glucose-Capillary: 145 mg/dL — ABNORMAL HIGH (ref 65–99)

## 2015-11-21 DIAGNOSIS — E119 Type 2 diabetes mellitus without complications: Secondary | ICD-10-CM | POA: Diagnosis not present

## 2015-11-21 LAB — GLUCOSE, CAPILLARY
Glucose-Capillary: 193 mg/dL — ABNORMAL HIGH (ref 65–99)
Glucose-Capillary: 198 mg/dL — ABNORMAL HIGH (ref 65–99)
Glucose-Capillary: 297 mg/dL — ABNORMAL HIGH (ref 65–99)

## 2015-11-22 DIAGNOSIS — E119 Type 2 diabetes mellitus without complications: Secondary | ICD-10-CM | POA: Diagnosis not present

## 2015-11-22 LAB — GLUCOSE, CAPILLARY: GLUCOSE-CAPILLARY: 225 mg/dL — AB (ref 65–99)

## 2015-11-23 DIAGNOSIS — E119 Type 2 diabetes mellitus without complications: Secondary | ICD-10-CM | POA: Diagnosis not present

## 2015-11-23 LAB — GLUCOSE, CAPILLARY: Glucose-Capillary: 160 mg/dL — ABNORMAL HIGH (ref 65–99)

## 2015-11-24 DIAGNOSIS — E119 Type 2 diabetes mellitus without complications: Secondary | ICD-10-CM | POA: Diagnosis not present

## 2015-11-24 LAB — GLUCOSE, CAPILLARY
Glucose-Capillary: 178 mg/dL — ABNORMAL HIGH (ref 65–99)
Glucose-Capillary: 294 mg/dL — ABNORMAL HIGH (ref 65–99)

## 2015-11-25 DIAGNOSIS — E119 Type 2 diabetes mellitus without complications: Secondary | ICD-10-CM | POA: Diagnosis not present

## 2015-11-25 LAB — GLUCOSE, CAPILLARY: GLUCOSE-CAPILLARY: 162 mg/dL — AB (ref 65–99)

## 2015-11-26 DIAGNOSIS — E119 Type 2 diabetes mellitus without complications: Secondary | ICD-10-CM | POA: Diagnosis not present

## 2015-11-26 LAB — GLUCOSE, CAPILLARY
GLUCOSE-CAPILLARY: 200 mg/dL — AB (ref 65–99)
Glucose-Capillary: 305 mg/dL — ABNORMAL HIGH (ref 65–99)

## 2015-11-27 DIAGNOSIS — E119 Type 2 diabetes mellitus without complications: Secondary | ICD-10-CM | POA: Diagnosis not present

## 2015-11-27 LAB — GLUCOSE, CAPILLARY
GLUCOSE-CAPILLARY: 127 mg/dL — AB (ref 65–99)
Glucose-Capillary: 205 mg/dL — ABNORMAL HIGH (ref 65–99)

## 2015-11-28 DIAGNOSIS — E119 Type 2 diabetes mellitus without complications: Secondary | ICD-10-CM | POA: Diagnosis not present

## 2015-11-28 LAB — GLUCOSE, CAPILLARY
GLUCOSE-CAPILLARY: 213 mg/dL — AB (ref 65–99)
Glucose-Capillary: 292 mg/dL — ABNORMAL HIGH (ref 65–99)

## 2015-11-29 DIAGNOSIS — E119 Type 2 diabetes mellitus without complications: Secondary | ICD-10-CM | POA: Diagnosis not present

## 2015-11-29 LAB — GLUCOSE, CAPILLARY
GLUCOSE-CAPILLARY: 256 mg/dL — AB (ref 65–99)
GLUCOSE-CAPILLARY: 277 mg/dL — AB (ref 65–99)

## 2015-12-01 DIAGNOSIS — E119 Type 2 diabetes mellitus without complications: Secondary | ICD-10-CM | POA: Diagnosis not present

## 2015-12-01 LAB — GLUCOSE, CAPILLARY: GLUCOSE-CAPILLARY: 221 mg/dL — AB (ref 65–99)

## 2015-12-04 ENCOUNTER — Encounter: Admission: RE | Admit: 2015-12-04 | Payer: Medicare Other | Source: Ambulatory Visit | Admitting: Internal Medicine

## 2016-01-17 IMAGING — CT CT ANGIO CHEST
2 of 6 series · 18 of 36 positions shown · IV contrast (isovue)
Comparison: Chest radiograph performed earlier today at [DATE] a.m.,
and CT of the chest performed 03/13/2012

CLINICAL DATA: Acute onset of shortness of breath. Decreased O2
saturation. Initial encounter.

EXAM:
CT ANGIOGRAPHY CHEST WITH CONTRAST
TECHNIQUE: Multidetector CT imaging of the chest was performed using the
standard protocol during bolus administration of intravenous
contrast. Multiplanar CT image reconstructions and MIPs were
obtained to evaluate the vascular anatomy.
CONTRAST:  75 mL of Isovue 370 IV contrast

[Series 5: pe 1.0 thins · axial · 0.72mm/px · z∈[-343,-53]mm · 17 of 328 slices shown]
[im 19/328  lung]
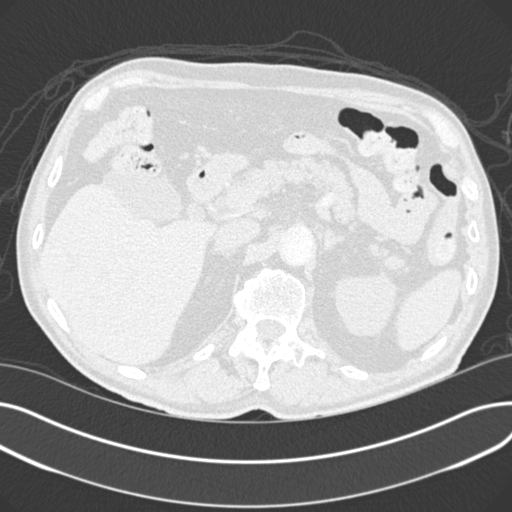
[im 37/328  mediastinal]
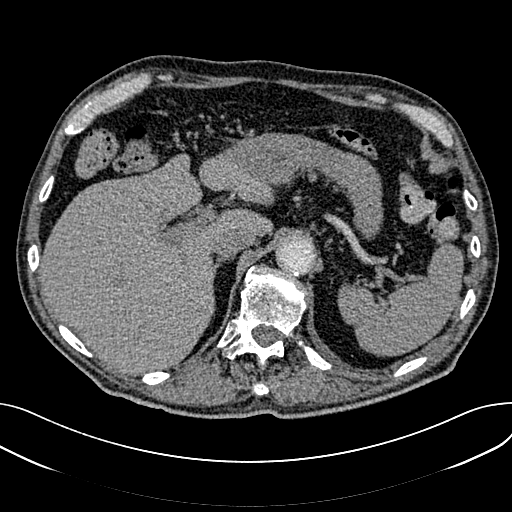
[im 55/328  lung]
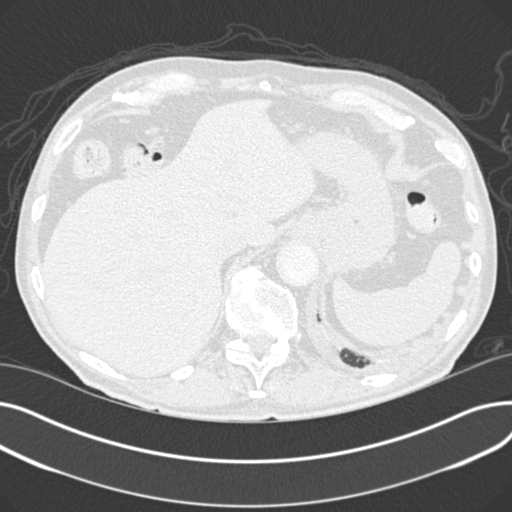
[im 73/328  mediastinal]
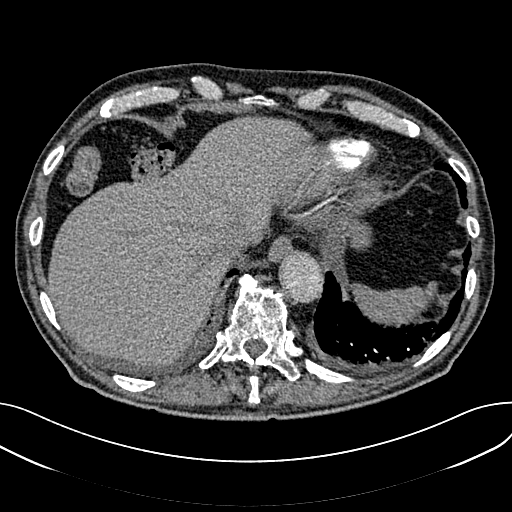
[im 91/328  lung]
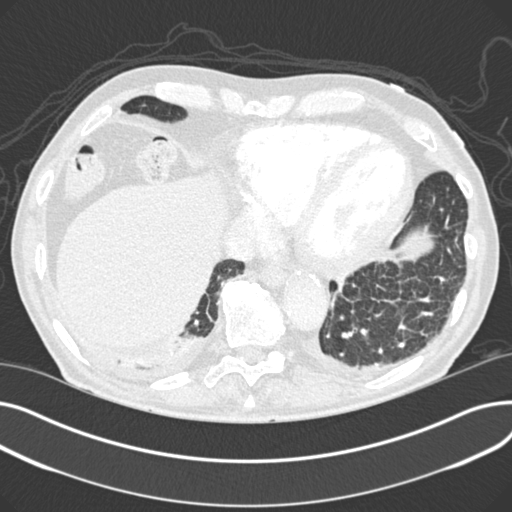
[im 110/328  mediastinal]
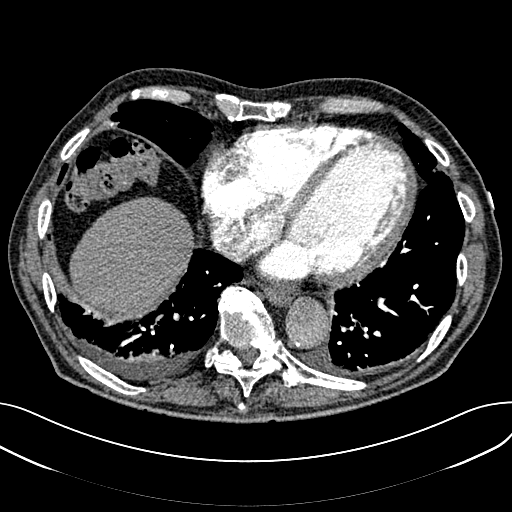
[im 128/328  lung]
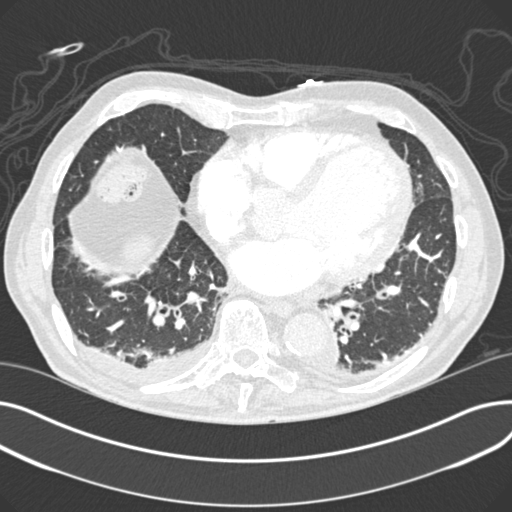
[im 146/328  mediastinal]
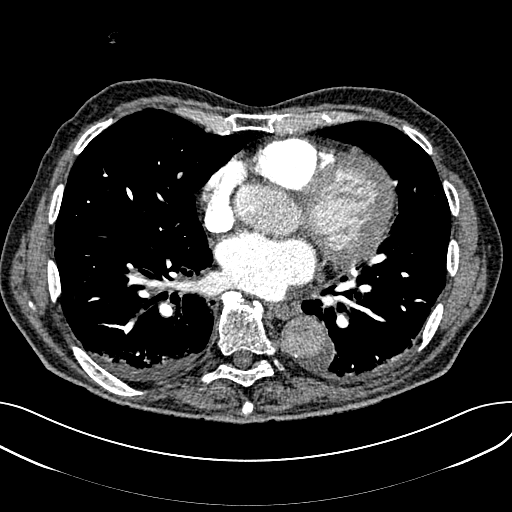
[im 164/328  lung]
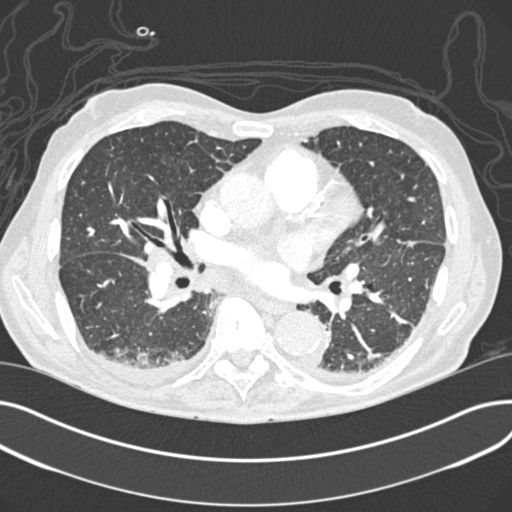
[im 182/328  mediastinal]
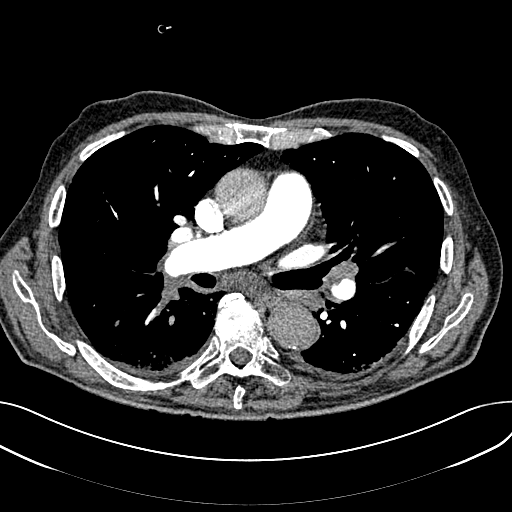
[im 200/328  lung]
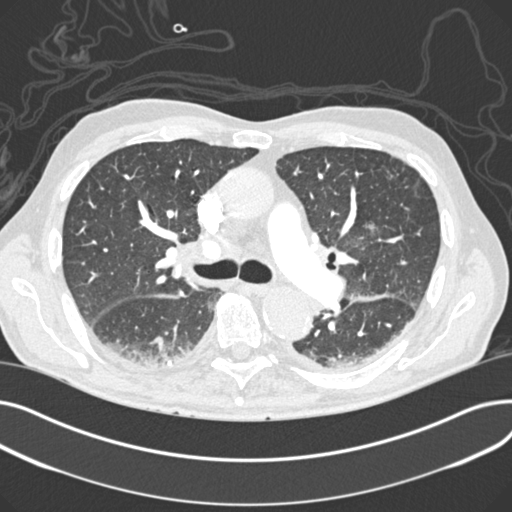
[im 219/328  mediastinal]
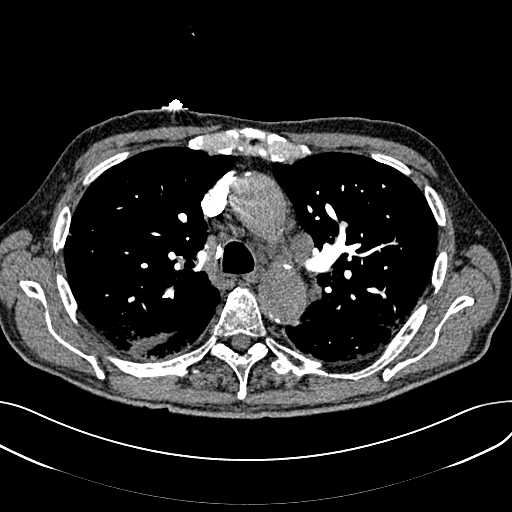
[im 237/328  lung]
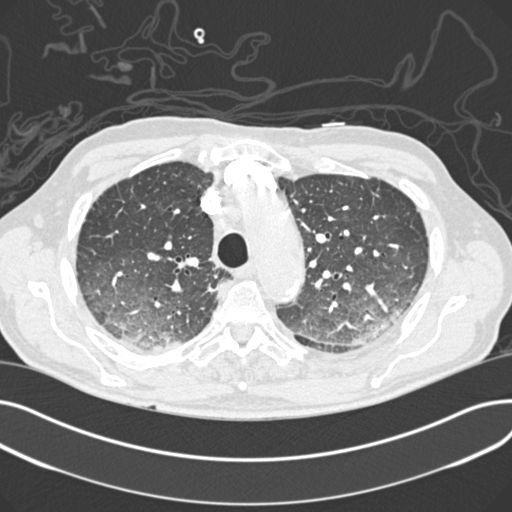
[im 255/328  mediastinal]
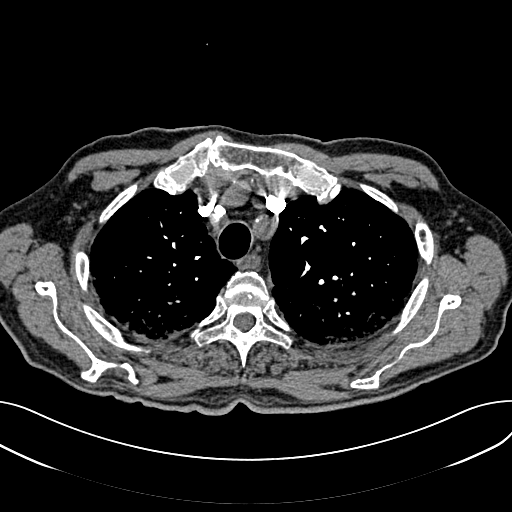
[im 273/328  lung]
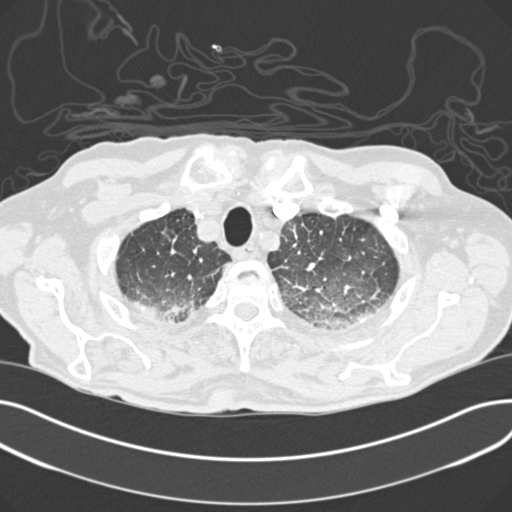
[im 291/328  mediastinal]
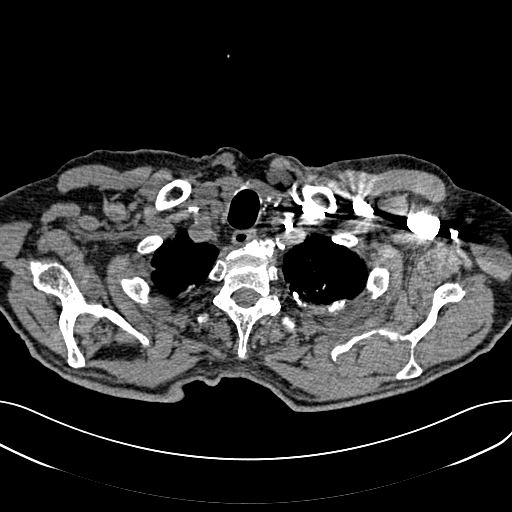
[im 309/328  lung]
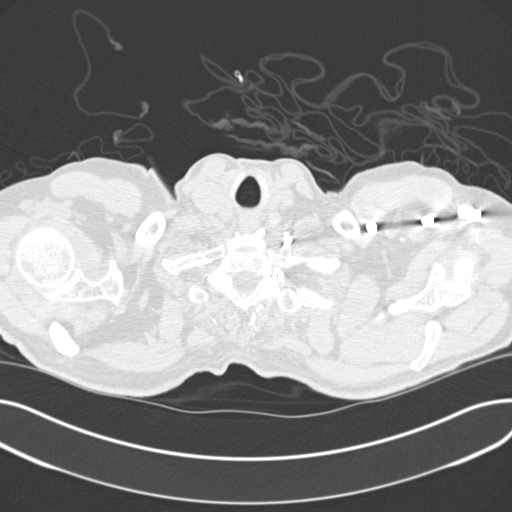

[Series 7: cor pe 2.0 mpr · coronal · 0.64mm/px · 1 of 124 slices shown]
[im 62/124  mediastinal]
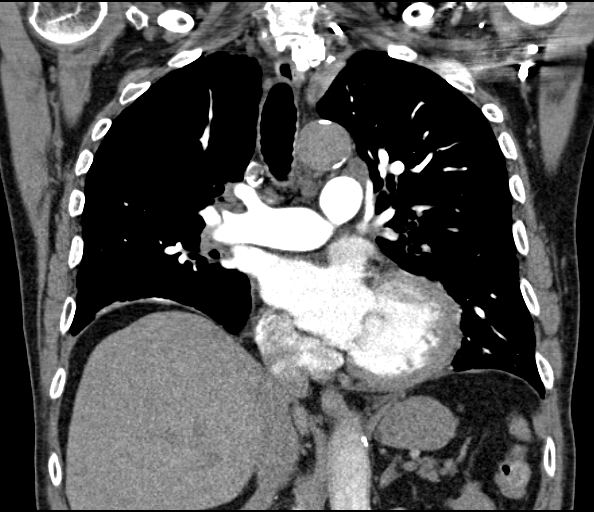

[18 of 36 positions shown; findings below may reference images not displayed]

FINDINGS: There is a small amount of pulmonary embolus within pulmonary
arterial branches to the right upper lobe, and minimal web is noted
within the pulmonary arteries to the lower lobes bilaterally,
reflecting underlying chronic sequelae of pulmonary embolus.

Trace bilateral pleural effusions are noted. Underlying interstitial
prominence is seen, raising concern for mild interstitial edema.
There is no evidence of pneumothorax. No masses are identified; no
abnormal focal contrast enhancement is seen.

Scattered enlarged periaortic, right paratracheal and subcarinal
nodes are seen, measuring up to 1.8 cm in short axis. Mildly
prominent bilateral hilar nodes are also seen. Diffuse coronary
artery calcification is noted. Mild cardiomegaly is noted. No
pericardial effusion is seen. The great vessels are grossly
unremarkable in appearance. No axillary lymphadenopathy is seen. The
thyroid gland is unremarkable in appearance.

The visualized portions of the liver and spleen are unremarkable.
Stones are seen layering dependently within the gallbladder. The
visualized portions of the pancreas and adrenal glands are
unremarkable.

No acute osseous abnormalities are seen.

Review of the MIP images confirms the above findings.
IMPRESSION: 1. Small amount of pulmonary embolus within pulmonary arterial
branches to the right upper lung lobe, and minimal web within
pulmonary arteries to the lower lobes bilaterally, reflecting
underlying chronic sequelae of pulmonary embolus.
2. Trace bilateral pleural effusions noted. Underlying interstitial
prominence seen, raising concern for mild interstitial edema.
3. Significantly increased prominence of mediastinal nodes and
mildly prominent bilateral hilar nodes, of uncertain significance.
This could reflect underlying infectious or inflammatory process,
though malignancy cannot be entirely excluded.
4. Diffuse coronary artery calcifications seen.
5. Mild cardiomegaly noted.
6. Cholelithiasis noted; gallbladder otherwise grossly unremarkable
in appearance.

Critical Value/emergent results were called by telephone at the time
of interpretation on 03/02/2014 at [DATE] to Dr. WURYANDARI MAZZONICK, who
verbally acknowledged these results.

## 2016-04-13 ENCOUNTER — Emergency Department: Payer: Medicare Other

## 2016-04-13 ENCOUNTER — Observation Stay: Payer: Medicare Other

## 2016-04-13 ENCOUNTER — Inpatient Hospital Stay
Admission: EM | Admit: 2016-04-13 | Discharge: 2016-04-14 | DRG: 281 | Disposition: A | Discharge status: Home / Self Care | Payer: Medicare Other | Attending: Internal Medicine | Admitting: Internal Medicine

## 2016-04-13 ENCOUNTER — Encounter: Payer: Self-pay | Admitting: Emergency Medicine

## 2016-04-13 DIAGNOSIS — I214 Non-ST elevation (NSTEMI) myocardial infarction: Secondary | ICD-10-CM | POA: Diagnosis present

## 2016-04-13 DIAGNOSIS — I110 Hypertensive heart disease with heart failure: Secondary | ICD-10-CM | POA: Diagnosis present

## 2016-04-13 DIAGNOSIS — Z7982 Long term (current) use of aspirin: Secondary | ICD-10-CM | POA: Diagnosis not present

## 2016-04-13 DIAGNOSIS — Z66 Do not resuscitate: Secondary | ICD-10-CM | POA: Diagnosis present

## 2016-04-13 DIAGNOSIS — K219 Gastro-esophageal reflux disease without esophagitis: Secondary | ICD-10-CM | POA: Diagnosis present

## 2016-04-13 DIAGNOSIS — I21A1 Myocardial infarction type 2: Secondary | ICD-10-CM | POA: Diagnosis not present

## 2016-04-13 DIAGNOSIS — I5022 Chronic systolic (congestive) heart failure: Secondary | ICD-10-CM | POA: Diagnosis present

## 2016-04-13 DIAGNOSIS — Z833 Family history of diabetes mellitus: Secondary | ICD-10-CM | POA: Diagnosis not present

## 2016-04-13 DIAGNOSIS — I429 Cardiomyopathy, unspecified: Secondary | ICD-10-CM | POA: Diagnosis present

## 2016-04-13 DIAGNOSIS — I252 Old myocardial infarction: Secondary | ICD-10-CM

## 2016-04-13 DIAGNOSIS — R079 Chest pain, unspecified: Secondary | ICD-10-CM | POA: Diagnosis not present

## 2016-04-13 DIAGNOSIS — Z888 Allergy status to other drugs, medicaments and biological substances status: Secondary | ICD-10-CM | POA: Diagnosis not present

## 2016-04-13 DIAGNOSIS — Z7901 Long term (current) use of anticoagulants: Secondary | ICD-10-CM | POA: Diagnosis not present

## 2016-04-13 DIAGNOSIS — Z79899 Other long term (current) drug therapy: Secondary | ICD-10-CM | POA: Diagnosis not present

## 2016-04-13 DIAGNOSIS — M7989 Other specified soft tissue disorders: Secondary | ICD-10-CM

## 2016-04-13 DIAGNOSIS — E785 Hyperlipidemia, unspecified: Secondary | ICD-10-CM | POA: Diagnosis present

## 2016-04-13 DIAGNOSIS — I249 Acute ischemic heart disease, unspecified: Secondary | ICD-10-CM

## 2016-04-13 DIAGNOSIS — Z86718 Personal history of other venous thrombosis and embolism: Secondary | ICD-10-CM | POA: Diagnosis not present

## 2016-04-13 DIAGNOSIS — Z794 Long term (current) use of insulin: Secondary | ICD-10-CM | POA: Diagnosis not present

## 2016-04-13 DIAGNOSIS — I259 Chronic ischemic heart disease, unspecified: Secondary | ICD-10-CM

## 2016-04-13 DIAGNOSIS — Z885 Allergy status to narcotic agent status: Secondary | ICD-10-CM

## 2016-04-13 DIAGNOSIS — E039 Hypothyroidism, unspecified: Secondary | ICD-10-CM | POA: Diagnosis present

## 2016-04-13 DIAGNOSIS — E119 Type 2 diabetes mellitus without complications: Secondary | ICD-10-CM | POA: Diagnosis present

## 2016-04-13 DIAGNOSIS — O2230 Deep phlebothrombosis in pregnancy, unspecified trimester: Secondary | ICD-10-CM

## 2016-04-13 LAB — BASIC METABOLIC PANEL
Anion gap: 9 (ref 5–15)
BUN: 21 mg/dL — AB (ref 6–20)
CHLORIDE: 105 mmol/L (ref 101–111)
CO2: 21 mmol/L — AB (ref 22–32)
CREATININE: 0.84 mg/dL (ref 0.61–1.24)
Calcium: 8.8 mg/dL — ABNORMAL LOW (ref 8.9–10.3)
GFR calc Af Amer: >60 mL/min (ref >60)
GFR calc non Af Amer: >60 mL/min (ref >60)
Glucose, Bld: 285 mg/dL — ABNORMAL HIGH (ref 65–99)
Potassium: 4 mmol/L (ref 3.5–5.1)
SODIUM: 135 mmol/L (ref 135–145)

## 2016-04-13 LAB — GLUCOSE, CAPILLARY
Glucose-Capillary: 203 mg/dL — ABNORMAL HIGH (ref 65–99)
Glucose-Capillary: 205 mg/dL — ABNORMAL HIGH (ref 65–99)
Glucose-Capillary: 152 mg/dL — ABNORMAL HIGH (ref 65–99)

## 2016-04-13 LAB — TROPONIN I
Troponin I: 0.03 ng/mL (ref <0.03)
Troponin I: 0.12 ng/mL (ref <0.03)
Troponin I: 0.37 ng/mL (ref <0.03)
Troponin I: 0.3 ng/mL (ref <0.03)

## 2016-04-13 LAB — CBC
HCT: 43 % (ref 40.0–52.0)
Hemoglobin: 14.5 g/dL (ref 13.0–18.0)
MCH: 32.9 pg (ref 26.0–34.0)
MCHC: 33.7 g/dL (ref 32.0–36.0)
MCV: 97.8 fL (ref 80.0–100.0)
PLATELETS: 186 10*3/uL (ref 150–440)
RBC: 4.39 MIL/uL — ABNORMAL LOW (ref 4.40–5.90)
RDW: 13.8 % (ref 11.5–14.5)
WBC: 11.5 10*3/uL — ABNORMAL HIGH (ref 3.8–10.6)

## 2016-04-13 LAB — MRSA PCR SCREENING: MRSA by PCR: POSITIVE — AB

## 2016-04-13 MED ORDER — LEVOTHYROXINE SODIUM 100 MCG PO TABS
100.0000 ug | ORAL_TABLET | Freq: Every day | ORAL | Status: DC
  Administered 2016-04-13 – 2016-04-14 (×2): 100 ug via ORAL
  Filled 2016-04-13: qty 1
  Filled 2016-04-13: qty 2

## 2016-04-13 MED ORDER — VITAMIN D 1000 UNITS PO TABS
1000.0000 [IU] | ORAL_TABLET | Freq: Every day | ORAL | Status: DC
  Administered 2016-04-14: 1000 [IU] via ORAL
  Filled 2016-04-13: qty 1

## 2016-04-13 MED ORDER — FUROSEMIDE 40 MG PO TABS
40.0000 mg | ORAL_TABLET | Freq: Every day | ORAL | Status: DC
  Administered 2016-04-13: 40 mg via ORAL
  Filled 2016-04-13: qty 1

## 2016-04-13 MED ORDER — SODIUM CHLORIDE 0.9% FLUSH
3.0000 mL | Freq: Two times a day (BID) | INTRAVENOUS | Status: DC
  Administered 2016-04-13: 3 mL via INTRAVENOUS

## 2016-04-13 MED ORDER — SODIUM CHLORIDE 0.9 % IV SOLN: 250.0000 mL | INTRAVENOUS | Status: DC | PRN

## 2016-04-13 MED ORDER — MIRTAZAPINE 15 MG PO TABS
7.5000 mg | ORAL_TABLET | Freq: Every day | ORAL | Status: DC
  Administered 2016-04-13 – 2016-04-14 (×2): 7.5 mg via ORAL
  Filled 2016-04-13 (×2): qty 1

## 2016-04-13 MED ORDER — ACETAMINOPHEN 325 MG PO TABS: 650.0000 mg | ORAL_TABLET | ORAL | Status: DC | PRN

## 2016-04-13 MED ORDER — ONDANSETRON HCL 4 MG/2ML IJ SOLN: 4.0000 mg | Freq: Four times a day (QID) | INTRAMUSCULAR | Status: DC | PRN

## 2016-04-13 MED ORDER — ISOSORBIDE MONONITRATE ER 30 MG PO TB24
30.0000 mg | ORAL_TABLET | Freq: Every day | ORAL | Status: DC
  Administered 2016-04-14: 30 mg via ORAL
  Filled 2016-04-13: qty 1

## 2016-04-13 MED ORDER — INSULIN ASPART 100 UNIT/ML ~~LOC~~ SOLN: 0.0000 [IU] | Freq: Every day | SUBCUTANEOUS | Status: DC

## 2016-04-13 MED ORDER — ASPIRIN 81 MG PO CHEW
324.0000 mg | CHEWABLE_TABLET | ORAL | Status: AC
  Filled 2016-04-13 (×3): qty 4

## 2016-04-13 MED ORDER — INSULIN ASPART 100 UNIT/ML ~~LOC~~ SOLN
0.0000 [IU] | Freq: Three times a day (TID) | SUBCUTANEOUS | Status: DC
  Administered 2016-04-13: 3 [IU] via SUBCUTANEOUS
  Filled 2016-04-13: qty 3

## 2016-04-13 MED ORDER — INSULIN GLARGINE 100 UNIT/ML ~~LOC~~ SOLN
20.0000 [IU] | Freq: Every day | SUBCUTANEOUS | Status: DC
  Administered 2016-04-13: 20 [IU] via SUBCUTANEOUS
  Filled 2016-04-13 (×3): qty 0.2

## 2016-04-13 MED ORDER — SODIUM CHLORIDE 0.9% FLUSH: 3.0000 mL | INTRAVENOUS | Status: DC | PRN

## 2016-04-13 MED ORDER — NITROGLYCERIN 0.4 MG SL SUBL
0.4000 mg | SUBLINGUAL_TABLET | SUBLINGUAL | Status: DC | PRN
Start: 2016-04-13 — End: 2016-04-13

## 2016-04-13 MED ORDER — MUPIROCIN 2 % EX OINT
1.0000 "application " | TOPICAL_OINTMENT | Freq: Two times a day (BID) | CUTANEOUS | Status: DC
  Administered 2016-04-14: 1 via NASAL
  Filled 2016-04-13: qty 22

## 2016-04-13 MED ORDER — INSULIN GLARGINE 100 UNIT/ML ~~LOC~~ SOLN
26.0000 [IU] | Freq: Every day | SUBCUTANEOUS | Status: DC
  Filled 2016-04-13: qty 0.26

## 2016-04-13 MED ORDER — ASPIRIN EC 81 MG PO TBEC
81.0000 mg | DELAYED_RELEASE_TABLET | Freq: Every day | ORAL | Status: DC
  Administered 2016-04-14: 81 mg via ORAL
  Filled 2016-04-13: qty 1

## 2016-04-13 MED ORDER — CARVEDILOL 3.125 MG PO TABS
3.1250 mg | ORAL_TABLET | Freq: Two times a day (BID) | ORAL | Status: DC
  Administered 2016-04-13: 3.125 mg via ORAL
  Filled 2016-04-13 (×2): qty 1

## 2016-04-13 MED ORDER — NITROGLYCERIN 0.4 MG SL SUBL: 0.4000 mg | SUBLINGUAL_TABLET | SUBLINGUAL | Status: DC | PRN

## 2016-04-13 MED ORDER — GLIPIZIDE 10 MG PO TABS
10.0000 mg | ORAL_TABLET | Freq: Two times a day (BID) | ORAL | Status: DC
  Administered 2016-04-13: 10 mg via ORAL
  Filled 2016-04-13 (×3): qty 1

## 2016-04-13 MED ORDER — SPIRONOLACTONE 25 MG PO TABS
12.5000 mg | ORAL_TABLET | Freq: Every day | ORAL | Status: DC
  Administered 2016-04-13 – 2016-04-14 (×2): 12.5 mg via ORAL
  Filled 2016-04-13: qty 0.5
  Filled 2016-04-13: qty 1

## 2016-04-13 MED ORDER — POTASSIUM CHLORIDE ER 10 MEQ PO TBCR
10.0000 meq | EXTENDED_RELEASE_TABLET | Freq: Every day | ORAL | Status: DC
  Administered 2016-04-13 – 2016-04-14 (×2): 10 meq via ORAL
  Filled 2016-04-13 (×3): qty 1

## 2016-04-13 MED ORDER — ASPIRIN 300 MG RE SUPP: 300.0000 mg | RECTAL | Status: AC

## 2016-04-13 MED ORDER — ENOXAPARIN SODIUM 80 MG/0.8ML ~~LOC~~ SOLN
80.0000 mg | Freq: Two times a day (BID) | SUBCUTANEOUS | Status: DC
  Administered 2016-04-13 (×2): 80 mg via SUBCUTANEOUS
  Filled 2016-04-13 (×3): qty 0.8

## 2016-04-13 MED ORDER — PANTOPRAZOLE SODIUM 40 MG PO TBEC
40.0000 mg | DELAYED_RELEASE_TABLET | Freq: Every day | ORAL | Status: DC
  Administered 2016-04-13 – 2016-04-14 (×2): 40 mg via ORAL
  Filled 2016-04-13 (×2): qty 1

## 2016-04-13 MED ORDER — FUROSEMIDE 10 MG/ML IJ SOLN
40.0000 mg | Freq: Two times a day (BID) | INTRAMUSCULAR | Status: DC
  Administered 2016-04-13: 40 mg via INTRAVENOUS
  Filled 2016-04-13: qty 4

## 2016-04-13 MED ORDER — CHLORHEXIDINE GLUCONATE CLOTH 2 % EX PADS: 6.0000 | MEDICATED_PAD | Freq: Every day | CUTANEOUS | Status: DC

## 2016-04-13 MED ORDER — GLIPIZIDE 5 MG PO TABS
5.0000 mg | ORAL_TABLET | Freq: Two times a day (BID) | ORAL | Status: DC
  Administered 2016-04-13 – 2016-04-14 (×2): 5 mg via ORAL
  Filled 2016-04-13 (×3): qty 1

## 2016-04-13 NOTE — Consult Note (Signed)
[facility]  CARDIOLOGY CONSULT NOTE  Patient ID: Ruben Gallegos MRN: [id number] DOB/AGE: [birth year] 83 y.o.  Admit date: 04/13/2016 Referring Physician Dr. Estanislado Pandy Primary Physician  Dr. Caryl Comes Primary Cardiologist Dr. Nehemiah Massed Reason for Consultation chest pain  HPI: Pt is a 83 yo male with history of cad s/p pci who presented to the er with complaints of chest pain that occurred while at rest beginning at 11pm with radiation to his left side, 4/10 in scale. He has also noted to have increased swelling in his legs , worse in his left leg over the past several weeks. He was on xarelto which was held for several weeks due to a fall but he has been back on the xarelto. He had an ultrasound of his leg which showed no dvt. He had a troponin of 0.12 in the er and he was admittted. He currently is pain free. EKG was normal. CXR revealed elevation of the right hemidiaphragm but no pulmonary edema or airspace disease. He has a known ef of 15-20% documented by echo during admission in 5/17. He is currently hemodynamically stable and resing in bed. Third troponin was 0.37. He and his son are not interested in invasive evaluation. He is very funcitonal at home.   Review of Systems  Constitutional: Negative.   HENT: Negative.   Eyes: Negative.   Respiratory: Negative.   Cardiovascular: Positive for chest pain and leg swelling.  Gastrointestinal: Negative.   Genitourinary: Negative.   Musculoskeletal: Negative.   Skin: Negative.   Neurological: Negative.   Endo/Heme/Allergies: Negative.   Psychiatric/Behavioral: Negative.     Past Medical History:  Diagnosis Date  . Cancer ([facility])   . CHF (congestive heart failure) ([facility])   . Diabetes mellitus without complication ([facility])   . GERD (gastroesophageal reflux disease)   . HLD (hyperlipidemia)   . Hypertension   . Hypothyroidism     Family History  Problem Relation Age of Onset  . CAD    . Diabetes    .  Diabetes Mother   . CVA Father     Social History   Social History  . Marital status: Widowed    Spouse name: N/A  . Number of children: N/A  . Years of education: N/A   Occupational History  . retired    Social History Main Topics  . Smoking status: Former Research scientist (life sciences)  . Smokeless tobacco: Never Used  . Alcohol use 0.0 oz/week  . Drug use: No  . Sexual activity: Not on file   Other Topics Concern  . Not on file   Social History Narrative  . No narrative on file    Past Surgical History:  Procedure Laterality Date  . APPENDECTOMY    . CATARACT EXTRACTION    . HERNIA REPAIR       Prescriptions Prior to Admission  Medication Sig Dispense Refill Last Dose  . acetaminophen (TYLENOL) 325 MG tablet Take 325 mg by mouth 3 (three) times daily.   04/12/2016 at Unknown time  . carvedilol (COREG) 3.125 MG tablet Take 3.125 mg by mouth 2 (two) times daily.   04/12/2016 at Unknown time  . Cholecalciferol (VITAMIN D-3) 1000 units CAPS Take 1,000 Units by mouth daily.   04/12/2016 at Unknown time  . esomeprazole (NEXIUM) 40 MG capsule Take 40 mg by mouth daily.   04/12/2016 at Unknown time  . fluvastatin (LESCOL) 40 MG capsule Take 40 mg by mouth at bedtime.   04/12/2016 at Unknown  time  . furosemide (LASIX) 40 MG tablet Take 1 tablet (40 mg total) by mouth daily. 30 tablet  prn  . glipiZIDE (GLUCOTROL) 10 MG tablet Take 10 mg by mouth 2 (two) times daily.   04/12/2016 at Unknown time  . insulin glargine (LANTUS) 100 UNIT/ML injection Inject 26 Units into the skin at bedtime.   04/12/2016 at Unknown time  . insulin lispro (HUMALOG) 100 UNIT/ML injection Inject into the skin See admin instructions. Per sliding scale     . levothyroxine (SYNTHROID, LEVOTHROID) 100 MCG tablet Take 100 mcg by mouth daily.   04/12/2016 at Unknown time  . mirtazapine (REMERON) 7.5 MG tablet Take 7.5 mg by mouth daily.   04/12/2016 at Unknown time  . nitroGLYCERIN (NITROSTAT) 0.4 MG SL tablet Place 0.4 mg under the tongue every 5  (five) minutes as needed for chest pain.   prn  . spironolactone (ALDACTONE) 25 MG tablet Take 12.5 mg by mouth daily.   04/12/2016 at Unknown time  . XARELTO 20 MG TABS tablet Take 20 mg by mouth daily.   04/12/2016 at Unknown time  . potassium chloride (K-DUR) 10 MEQ tablet Take 10 mEq by mouth daily.   Not Taking at Unknown time    Physical Exam: Blood pressure 103/78, pulse 79, temperature 97.7 F (36.5 C), temperature source Oral, resp. rate 18, height 6' (1.829 m), weight 173 lb (78.5 kg), SpO2 94 %.   Wt Readings from Last 1 Encounters:  04/13/16 173 lb (78.5 kg)     General appearance: alert and cooperative Resp: clear to auscultation bilaterally Cardio: regular rate and rhythm GI: soft, non-tender; bowel sounds normal; no masses,  no organomegaly Extremities: edema 2-3+ leg edema Neurologic: Grossly normal  Labs:   Lab Results  Component Value Date   WBC 11.5 (H) 04/13/2016   HGB 14.5 04/13/2016   HCT 43.0 04/13/2016   MCV 97.8 04/13/2016   PLT 186 04/13/2016    Recent Labs Lab 04/13/16 0147  NA 135  K 4.0  CL 105  CO2 21*  BUN 21*  CREATININE 0.84  CALCIUM 8.8*  GLUCOSE 285*   Lab Results  Component Value Date   TROPONINI 0.37 ([place]) 04/13/2016      Radiology: no pulmonary edema EKG: nsr with no ischemia  ASSESSMENT AND PLAN:  83 yo male with history of cad s/p pci of lad in 3/05, dm, history of dvt in 2011 curretnly treated with xarelto 15 mg daily, cardiomyopthy with known ef of 15-20% who was admitted with progressive chest pain. He has no evidence of chf. No current chest pain. Has mild tropoinin elevation. Will attempt to control symptoms with medications due to advanced age and icreased risk for invasive evaluataion. Will continue with carvedilol and spironolactone at low dose. Will add low dose imdur and continue po lasix. No evidence of volume overload at present. Will resume xarelto in am at 15 mg due to history of dvt. Will use low dose asa at 81 mg  dialy.  Signed: Teodoro Spray MD, [facility] 04/13/2016, 6:38 PM

## 2016-04-13 NOTE — Progress Notes (Signed)
Admitted  this morning because of chest pain, shortness of breath. Patient is 83 year old male patient with history of chronic systolic heart failure with EF 15%, diabetes mellitus type 2, GERD, hyperlipidemia, hypertension, hypothyroidism came in for chest pain. Patient troponin slightly up at 0.12. Patient still waiting for bed upstairs. patient's family is very upset because patient had to wait in the emergency room for more than 12 hours to get her room. When the son is planning to take him home.   Vitals reviewed. Slightly low blood pressure today. No hypoxia.  Physical examination cardiovascular S1-S2 regular. Lungs clear to auscultation , has bilateral basal creps.   Assessment and plan; acute on chronic systolic heart failure:  Has worsening lower extremity edema: Imaging of her lower extremities did not show any blood clot. Patient's son does not want any invasive cardiac intervention. Due to his advanced age patient will be monitored for 24 hours for hemodynamic status. [facility] consult cardiology because of non-ST elevation MI. Continue Coreg, aspirin, full dose Lovenox,  #2 diabetes due to type II: On glipizide, Lantus. #2 hypothyroidism condition that. #4 chronic systolic heart failure EF 15%, patient on the colorectal, Aldactone, Lasix, supposed on Xarelto but stopped recently. CODE STATUS DO NOT RESUSCITATE Prognosis poor Discussed with son and he does not want any aggressive cardiac intervention and wants to take him home tomorrow,. Time spent'25 min

## 2016-04-13 NOTE — ED Notes (Signed)
Pt returned from [place], resting in bed, TV turned on

## 2016-04-13 NOTE — ED Notes (Signed)
Pt resting inn bed, family at bedside, pt awake and alert, on 2L [place], updated on plan of care awaiting room placement

## 2016-04-13 NOTE — H&P (Signed)
[facility] NAME: Ruben Gallegos    MR#:  [id number]  DATE OF BIRTH:  04-17-1920  DATE OF ADMISSION:  04/13/2016  PRIMARY CARE PHYSICIAN: Tama High III, MD   REQUESTING/REFERRING PHYSICIAN:   CHIEF COMPLAINT:   Chief Complaint  Patient presents with  . Chest Pain    HISTORY OF PRESENT ILLNESS: Ruben Gallegos  is a 83 y.o. male with a known history of Congestive heart failure, diabetes mellitus, GERD, hyperlipidemia, hypertension, hypothyroidism presented to the emergency room with chest pain. Patient experienced the chest pain around 11 PM last night located in the left side of the chest and was sharp in nature 4 out of 10 on scale of 1-10. Patient also has noticed increased swelling in the left lower extremity for the last couple of weeks. He was diuresed in the past for increased swelling in both lower extremities. He has history of heart failure. Patient was off Xarelto for couple of weeks in the past. Today he has more increased swelling in the left lower extremity than in the right leg. No complaints of any shortness of breath, orthopnea. Second set of troponin was elevated 0.12. EKG normal sinus rhythm with no ST segment changes. Hospitalist service was consulted for further care of the patient.  PAST MEDICAL HISTORY:   Past Medical History:  Diagnosis Date  . Cancer ([place])   . CHF (congestive heart failure) ([place])   . Diabetes mellitus without complication ([place])   . GERD (gastroesophageal reflux disease)   . HLD (hyperlipidemia)   . Hypertension   . Hypothyroidism     PAST SURGICAL HISTORY: Past Surgical History:  Procedure Laterality Date  . APPENDECTOMY    . CATARACT EXTRACTION    . HERNIA REPAIR      SOCIAL HISTORY:  Social History  Substance Use Topics  . Smoking status: Former Research scientist (life sciences)  . Smokeless tobacco: Never Used  . Alcohol use 0.0 oz/week    FAMILY HISTORY:  Family History  Problem Relation Age of  Onset  . CAD    . Diabetes    . Diabetes Mother   . CVA Father     DRUG ALLERGIES:  Allergies  Allergen Reactions  . Altace [Ramipril] Shortness Of Breath  . Tramadol Shortness Of Breath  . Actos [Pioglitazone] Swelling  . Codeine Nausea Only  . Demerol [Meperidine] Nausea Only  . Lipitor [Atorvastatin] Other (See Comments)  . Metformin And Related Other (See Comments)  . Mevacor [Lovastatin] Other (See Comments)    REVIEW OF SYSTEMS:   CONSTITUTIONAL: No fever, fatigue or weakness.  EYES: No blurred or double vision.  EARS, NOSE, AND THROAT: No tinnitus or ear pain.  RESPIRATORY: No cough, shortness of breath, wheezing or hemoptysis.  CARDIOVASCULAR: Has chest pain, no orthopnea, Has edema.  GASTROINTESTINAL: No nausea, vomiting, diarrhea or abdominal pain.  GENITOURINARY: No dysuria, hematuria.  ENDOCRINE: No polyuria, nocturia,  HEMATOLOGY: No anemia, easy bruising or bleeding SKIN: No rash or lesion. MUSCULOSKELETAL: No joint pain or arthritis.   Edema of lower extremity NEUROLOGIC: No tingling, numbness, weakness.  PSYCHIATRY: No anxiety or depression.   MEDICATIONS AT HOME:  Prior to Admission medications   Medication Sig Start Date End Date Taking? Authorizing Provider  acetaminophen (TYLENOL) 325 MG tablet Take 325 mg by mouth 3 (three) times daily.   Yes Historical Provider, MD  carvedilol (COREG) 3.125 MG tablet Take 3.125 mg by mouth 2 (two) times daily.  Yes Historical Provider, MD  Cholecalciferol (VITAMIN D-3) 1000 units CAPS Take 1,000 Units by mouth daily.   Yes Historical Provider, MD  esomeprazole (NEXIUM) 40 MG capsule Take 40 mg by mouth daily.   Yes Historical Provider, MD  fluvastatin (LESCOL) 40 MG capsule Take 40 mg by mouth at bedtime.   Yes Historical Provider, MD  furosemide (LASIX) 40 MG tablet Take 1 tablet (40 mg total) by mouth daily. 08/27/15  Yes Demetrios Loll, MD  glipiZIDE (GLUCOTROL) 10 MG tablet Take 10 mg by mouth 2 (two) times daily.    Yes Historical Provider, MD  insulin glargine (LANTUS) 100 UNIT/ML injection Inject 26 Units into the skin at bedtime.   Yes Historical Provider, MD  insulin lispro (HUMALOG) 100 UNIT/ML injection Inject into the skin See admin instructions. Per sliding scale   Yes Historical Provider, MD  levothyroxine (SYNTHROID, LEVOTHROID) 100 MCG tablet Take 100 mcg by mouth daily.   Yes Historical Provider, MD  mirtazapine (REMERON) 7.5 MG tablet Take 7.5 mg by mouth daily.   Yes Historical Provider, MD  nitroGLYCERIN (NITROSTAT) 0.4 MG SL tablet Place 0.4 mg under the tongue every 5 (five) minutes as needed for chest pain.   Yes Historical Provider, MD  spironolactone (ALDACTONE) 25 MG tablet Take 12.5 mg by mouth daily.   Yes Historical Provider, MD  XARELTO 20 MG TABS tablet Take 20 mg by mouth daily.   Yes Historical Provider, MD  potassium chloride (K-DUR) 10 MEQ tablet Take 10 mEq by mouth daily.    Historical Provider, MD      PHYSICAL EXAMINATION:   VITAL SIGNS: Blood pressure 99/64, pulse 83, temperature 98.3 F (36.8 C), temperature source Oral, resp. rate (!) 22, height 6' (1.829 m), weight 81.6 kg (180 lb), SpO2 94 %.  GENERAL:  83 y.o.-year-old patient lying in the bed with no acute distress.  EYES: Pupils equal, round, reactive to light and accommodation. No scleral icterus. Extraocular muscles intact.  HEENT: Head atraumatic, normocephalic. Oropharynx and nasopharynx clear.  NECK:  Supple, no jugular venous distention. No thyroid enlargement, no tenderness.  LUNGS: Normal breath sounds bilaterally, no wheezing, rales,rhonchi or crepitation. No use of accessory muscles of respiration.  CARDIOVASCULAR: S1, S2 normal. No murmurs, rubs, or gallops.  ABDOMEN: Soft, nontender, nondistended. Bowel sounds present. No organomegaly or mass.  EXTREMITIES: Has pedal edema, no cyanosis, or clubbing.  Increased left leg edema noted. NEUROLOGIC: Cranial nerves II through XII are intact. Muscle  strength 5/5 in all extremities. Sensation intact. Gait not checked.  PSYCHIATRIC: The patient is alert and oriented x 3.  SKIN: No obvious rash, lesion, or ulcer.   LABORATORY PANEL:   CBC  Recent Labs Lab 04/13/16 0147  WBC 11.5*  HGB 14.5  HCT 43.0  PLT 186  MCV 97.8  MCH 32.9  MCHC 33.7  RDW 13.8   ------------------------------------------------------------------------------------------------------------------  Chemistries   Recent Labs Lab 04/13/16 0147  NA 135  K 4.0  CL 105  CO2 21*  GLUCOSE 285*  BUN 21*  CREATININE 0.84  CALCIUM 8.8*   ------------------------------------------------------------------------------------------------------------------ estimated creatinine clearance is 60.3 mL/min (by C-G formula based on SCr of 0.84 mg/dL). ------------------------------------------------------------------------------------------------------------------ No results for input(s): TSH, T4TOTAL, T3FREE, THYROIDAB in the last 72 hours.  Invalid input(s): FREET3   Coagulation profile No results for input(s): INR, PROTIME in the last 168 hours. ------------------------------------------------------------------------------------------------------------------- No results for input(s): DDIMER in the last 72 hours. -------------------------------------------------------------------------------------------------------------------  Cardiac Enzymes  Recent Labs Lab 04/13/16 0147 04/13/16 [id number]  TROPONINI 0.03* 0.12*   ------------------------------------------------------------------------------------------------------------------ Invalid input(s): POCBNP  ---------------------------------------------------------------------------------------------------------------  Urinalysis    Component Value Date/Time   COLORURINE Yellow 10/09/2013 0345   APPEARANCEUR Clear 10/09/2013 0345   LABSPEC 1.020 10/09/2013 0345   PHURINE 6.0 10/09/2013 0345   GLUCOSEU 50  mg/dL 10/09/2013 0345   HGBUR Negative 10/09/2013 0345   BILIRUBINUR Negative 10/09/2013 0345   KETONESUR Trace 10/09/2013 0345   PROTEINUR 30 mg/dL 10/09/2013 0345   NITRITE Negative 10/09/2013 0345   LEUKOCYTESUR Negative 10/09/2013 0345     RADIOLOGY: Dg Chest 1 View  Result Date: 04/13/2016 CLINICAL DATA:  Chest pain for 1 hour. EXAM: CHEST 1 VIEW COMPARISON:  CT chest 08/24/2015.  Chest 08/24/2015. FINDINGS: Shallow inspiration with elevation of the right hemidiaphragm. Mild cardiac enlargement without vascular congestion or edema. Fibrosis or linear atelectasis in the lung bases. No focal consolidation. No blunting of costophrenic angles. No pneumothorax. Calcified and tortuous aorta. IMPRESSION: Elevation of the right hemidiaphragm. Linear atelectasis or fibrosis in the lung bases. Cardiac enlargement. No evidence of active consolidation. Electronically Signed   By: Lucienne Capers M.D.   On: 04/13/2016 02:07    EKG: Orders placed or performed during the hospital encounter of 04/13/16  . EKG 12-Lead  . EKG 12-Lead  . ED EKG within 10 minutes  . ED EKG within 10 minutes    IMPRESSION AND PLAN: 83 year old male patient with history of congestive heart failure, diabetes mellitus, hyperlipidemia, hypothyroid, hypertension presented to the emergency room with chest pain. Admitting diagnosis 1. Non-ST elevation MI 2. Increased left leg swelling rule out DVT 3. Congestive heart failure 4. Type 2 diabetes mellitus 5. Hyperlipidemia 6.Hypertension Treatment plan Admit patient to telemetry observation bed Diurese patient with IV Lasix Cycle troponin Cardiology consultation Venous Doppler ultrasound of left leg to rule out DVT Check echocardiogram Start patient on oral aspirin and when necessary nitrates for chest pain Anticoagulate patient with Lovenox full dose subcutaneously.  All the records are reviewed and case discussed with ED provider. Management plans discussed with  the patient, family and they are in agreement.  CODE STATUS:DNR Code Status History    Date Active Date Inactive Code Status Order ID Comments User Context   08/25/2015  1:16 AM 08/27/2015  2:37 PM DNR [id number]  Lance Coon, MD Inpatient    Questions for Most Recent Historical Code Status (Order [id number])    Question Answer Comment   In the event of cardiac or respiratory ARREST Do not call a "code blue"    In the event of cardiac or respiratory ARREST Do not perform Intubation, CPR, defibrillation or ACLS    In the event of cardiac or respiratory ARREST Use medication by any route, position, wound care, and other measures to relive pain and suffering. May use oxygen, suction and manual treatment of airway obstruction as needed for comfort.         Advance Directive Documentation   Flowsheet Row Most Recent Value  Type of Advance Directive  Out of facility DNR (pink MOST or yellow form)  Pre-existing out of facility DNR order (yellow form or pink MOST form)  Yellow form placed in chart (order not valid for inpatient use)  "MOST" Form in Place?  No data       TOTAL TIME TAKING CARE OF THIS PATIENT: 52 minutes.    Saundra Shelling M.D on 04/13/2016 at 6:38 AM  Between 7am to 6pm - Pager - [phone]  After 6pm go to [email or web address] - [place]  [facility]  Office  [phone]  CC: Primary care physician; [name], MD

## 2016-04-13 NOTE — ED Provider Notes (Signed)
[facility] Emergency Department Provider Note   ____________________________________________   First MD Initiated Contact with Patient 04/13/16 [phone]     (approximate)  I have reviewed the triage vital signs and the nursing notes.   HISTORY  Chief Complaint Chest Pain    HPI Ruben Gallegos is a 83 y.o. male who comes into the hospital today because he didn't feel well. The patient reports that he went to bed at 11 PM and then felt pain in his chest. The patient also felt nauseous and thought he would feel better. He reports that he tried to sleep all night but he couldn't. The patient reports that he called the nurse to come in and see him and they called the ambulance. The patient did not take anything for the pain before he came in. The patient was given a Nitropaste to his chest by EMS and he reports he received baby aspirin as well. The patient states that the pain is improved as well as the nausea. The patient went to [employer] earlier today and had been doing well. The patient denies any shortness of breath, sweats, vomiting or diarrhea. The patient also had no abdominal pain or radiation of his pain. The patient has had an MI in the past that presented as abdominal pain. The family reports that they're not sure why he did not take his nitroglycerin tonight.    Past Medical History:  Diagnosis Date  . Cancer ([place])   . CHF (congestive heart failure) ([place])   . Diabetes mellitus without complication ([place])   . GERD (gastroesophageal reflux disease)   . HLD (hyperlipidemia)   . Hypertension   . Hypothyroidism     Patient Active Problem List   Diagnosis Date Noted  . HLD (hyperlipidemia) 10/15/2015  . Acne erythematosa 10/15/2015  . Diabetes ([place]) 08/25/2015  . HTN (hypertension) 08/25/2015  . Hypothyroidism 08/25/2015  . Acute on chronic systolic CHF (congestive heart failure) ([place]) 08/25/2015  . GERD (gastroesophageal reflux disease)  08/25/2015  . Arteriosclerosis of coronary artery 08/10/2015  . Beat, premature ventricular 08/10/2015  . Paroxysmal supraventricular tachycardia ([place]) 05/21/2015  . H/O deep venous thrombosis 03/23/2015  . Healed or old pulmonary embolism 03/23/2015  . Edema leg 03/23/2015    Past Surgical History:  Procedure Laterality Date  . APPENDECTOMY    . CATARACT EXTRACTION    . HERNIA REPAIR      Prior to Admission medications   Medication Sig Start Date End Date Taking? Authorizing Provider  carvedilol (COREG) 3.125 MG tablet Take 3.125 mg by mouth 2 (two) times daily.    Historical Provider, MD  esomeprazole (NEXIUM) 40 MG capsule Take 40 mg by mouth daily.    Historical Provider, MD  furosemide (LASIX) 40 MG tablet Take 1 tablet (40 mg total) by mouth daily. 08/27/15   Demetrios Loll, MD  glipiZIDE (GLUCOTROL) 10 MG tablet Take 10 mg by mouth 2 (two) times daily.    Historical Provider, MD  insulin glargine (LANTUS) 100 UNIT/ML injection Inject 26 Units into the skin at bedtime.    Historical Provider, MD  levothyroxine (SYNTHROID, LEVOTHROID) 100 MCG tablet Take 100 mcg by mouth daily.    Historical Provider, MD  mirtazapine (REMERON) 7.5 MG tablet Take 7.5 mg by mouth daily.    Historical Provider, MD  nitroGLYCERIN (NITROSTAT) 0.4 MG SL tablet Place 0.4 mg under the tongue every 5 (five) minutes as needed for chest pain.    Historical Provider, MD  potassium chloride (K-DUR) 10 MEQ tablet Take 10 mEq by mouth daily.    Historical Provider, MD  XARELTO 20 MG TABS tablet Take 20 mg by mouth daily.    Historical Provider, MD    Allergies Altace [ramipril]; Tramadol; Actos [pioglitazone]; Codeine; Demerol [meperidine]; Lipitor [atorvastatin]; Metformin and related; and Mevacor [lovastatin]  Family History  Problem Relation Age of Onset  . CAD    . Diabetes      Social History Social History  Substance Use Topics  . Smoking status: Former Research scientist (life sciences)  . Smokeless tobacco: Never Used  .  Alcohol use 0.0 oz/week    Review of Systems Constitutional: No fever/chills Eyes: No visual changes. ENT: No sore throat. Cardiovascular:  chest pain. Respiratory: Denies shortness of breath. Gastrointestinal:  nausea, no vomiting.  No diarrhea.  No constipation. Genitourinary: Negative for dysuria. Musculoskeletal: Negative for back pain. Skin: Negative for rash. Neurological: Negative for headaches, focal weakness or numbness.  10-point ROS otherwise negative.  ____________________________________________   PHYSICAL EXAM:  VITAL SIGNS: ED Triage Vitals  Enc Vitals Group     BP 04/13/16 0145 133/78     Pulse Rate 04/13/16 0145 (!) 105     Resp 04/13/16 0145 (!) 25     Temp 04/13/16 0145 98.3 F (36.8 C)     Temp Source 04/13/16 0145 Oral     SpO2 04/13/16 0145 (!) 89 %     Weight 04/13/16 0147 180 lb (81.6 kg)     Height 04/13/16 0147 6' (1.829 m)     Head Circumference --      Peak Flow --      Pain Score 04/13/16 0147 5     Pain Loc --      Pain Edu? --      Excl. in [place]? --     Constitutional: Alert and oriented. Well appearing and in no acute distress. Eyes: Conjunctivae are normal. PERRL. EOMI. Head: Atraumatic. Nose: No congestion/rhinnorhea. Mouth/Throat: Mucous membranes are moist.  Oropharynx non-erythematous. Cardiovascular: Normal rate, regular rhythm. Grossly normal heart sounds.  Good peripheral circulation. Respiratory: Normal respiratory effort.  No retractions. Lungs CTAB. Gastrointestinal: Soft and nontender. No distention. Positive bowel sounds Musculoskeletal: No lower extremity tenderness nor edema.   Neurologic:  Normal speech and language.  Skin:  Skin is warm, dry and intact.  Psychiatric: Mood and affect are normal.   ____________________________________________   LABS (all labs ordered are listed, but only abnormal results are displayed)  Labs Reviewed  BASIC METABOLIC PANEL - Abnormal; Notable for the following:       Result  Value   CO2 21 (*)    Glucose, Bld 285 (*)    BUN 21 (*)    Calcium 8.8 (*)    All other components within normal limits  CBC - Abnormal; Notable for the following:    WBC 11.5 (*)    RBC 4.39 (*)    All other components within normal limits  TROPONIN I - Abnormal; Notable for the following:    Troponin I 0.03 (*)    All other components within normal limits  TROPONIN I - Abnormal; Notable for the following:    Troponin I 0.12 (*)    All other components within normal limits   ____________________________________________  EKG  ED ECG REPORT I, Loney Hering, the attending physician, personally viewed and interpreted this ECG.   Date: 04/13/2016  EKG Time: 145  Rate: 103  Rhythm: sinus tachycardia  Axis: left axis  deviation  Intervals:left bundle branch block EKG similar to previous   ST&T Change: LBBB no ST segment elevation, mild depression in V4  ____________________________________________  RADIOLOGY  CXR ____________________________________________   PROCEDURES  Procedure(s) performed: None  Procedures  Critical Care performed: No  ____________________________________________   INITIAL IMPRESSION / ASSESSMENT AND PLAN / ED COURSE  Pertinent labs & imaging results that were available during my care of the patient were reviewed by me and considered in my medical decision making (see chart for details).  This is a 83 year old male who comes into the hospital today with chest pain. The patient's pain is resolved at this time. The patient does have a DO NOT RESUSCITATE and the most form which determines the patient as comfort care but the family reports that they do want me to perform a troponin and repeat the troponin as well. I did inform the family to let me know if the patient has any change in his symptoms. He will receive a troponin and will be reassessed once I received all of his results.  Clinical Course as of Apr 13 546  Wed Apr 13, 2016    0249 Elevation of the right hemidiaphragm. Linear atelectasis or fibrosis in the lung bases. Cardiac enlargement. No evidence of active consolidation.   DG Chest 1 View [AW]    Clinical Course User Index [AW] Loney Hering, MD   The patient's repeat troponin was 0.12. I discussed the results with the family and the decision was made to keep the patient in the hospital for further evaluation. The patient will be admitted to the hospitalist service. HIs pain is still resolved at this time.   ____________________________________________   FINAL CLINICAL IMPRESSION(S) / ED DIAGNOSES  Final diagnoses:  Chest pain due to myocardial ischemia, unspecified ischemic chest pain type [facility])  ACS (acute coronary syndrome) ([place])      NEW MEDICATIONS STARTED DURING THIS VISIT:  New Prescriptions   No medications on file     Note:  This document was prepared using Dragon voice recognition software and may include unintentional dictation errors.    Loney Hering, MD 04/13/16 [phone]

## 2016-04-13 NOTE — ED Triage Notes (Addendum)
Pt presents to ED via [facility] independent living with c/o chest pain x 1 hour. Per EMS given 324 mg Aspirin and 1 inch nitro paste to chest. BP 138/88 pulse 120s. Bilateral pitting edema to lower extremities. Pt alert and oriented x 4, respirations even and unlabored.

## 2016-04-13 NOTE — Progress Notes (Signed)
ANTICOAGULATION CONSULT NOTE - Initial Consult  Pharmacy Consult for Enoxaparin dosing  Indication: chest pain/ACS and possible DVT   Allergies  Allergen Reactions  . Altace [Ramipril] Shortness Of Breath  . Tramadol Shortness Of Breath  . Actos [Pioglitazone] Swelling  . Codeine Nausea Only  . Demerol [Meperidine] Nausea Only  . Lipitor [Atorvastatin] Other (See Comments)  . Metformin And Related Other (See Comments)  . Mevacor [Lovastatin] Other (See Comments)    Patient Measurements: Height: 6' (182.9 cm) Weight: 180 lb (81.6 kg) IBW/kg (Calculated) : 77.6  Vital Signs: Temp: 98.3 F (36.8 C) (01/10 0145) Temp Source: Oral (01/10 0145) BP: 94/63 (01/10 1100) Pulse Rate: 73 (01/10 1100)  Labs:  Recent Labs  04/13/16 0147 04/13/16 0447  HGB 14.5  --   HCT 43.0  --   PLT 186  --   CREATININE 0.84  --   TROPONINI 0.03* 0.12*    Estimated Creatinine Clearance: 60.3 mL/min (by C-G formula based on SCr of 0.84 mg/dL).   Medical History: Past Medical History:  Diagnosis Date  . Cancer ([place])   . CHF (congestive heart failure) ([place])   . Diabetes mellitus without complication ([place])   . GERD (gastroesophageal reflux disease)   . HLD (hyperlipidemia)   . Hypertension   . Hypothyroidism     Assessment: 83 yo male with chest pain and increased left leg swelling. Pharmacy consulted for enoxaparin dosing. Patient on Xarelto prior to admission, last dose 1/9.   Plan:  Will start the patient on Enoxaparin (1mg /kg) 80mg  every 12 hours. CBC ordered with AM labs. F/U on doppler to R/O DVT.   Pernell Dupre, PharmD, BCPS Clinical Pharmacist 04/13/2016 11:30 AM

## 2016-04-13 NOTE — ED Notes (Signed)
Patient transported to Ultrasound 

## 2016-04-13 NOTE — Progress Notes (Signed)
Inpatient Diabetes Program Recommendations  AACE/ADA: New Consensus Statement on Inpatient Glycemic Control (2015)  Target Ranges:  Prepandial:   less than 140 mg/dL      Peak postprandial:   less than 180 mg/dL (1-2 hours)      Critically ill patients:  140 - 180 mg/dL   Review of Glycemic Control  Diabetes history: DM 2 Outpatient Diabetes medications: Lantus 26 units QHS, Humalog SSI, Glipizide 10 mg BID Current orders for Inpatient glycemic control: Lantus 26 units QHS, Glipizide 10 mg BID  Inpatient Diabetes Program Recommendations:   Due to patient's age, to avoid hypoglycemia: Please consider reducing home basal insulin to Lantus 20 units QHS, consider reducing glipizide dose or discontinuing and instead consider ordering Novolog Sensitive Correction AC + HS scale.  Thanks,  Tama Headings RN, MSN, [facility] Inpatient Diabetes Coordinator Team Pager [phone] (8a-5p)

## 2016-04-13 NOTE — ED Notes (Signed)
Pt resting in bed, family at bedside, eyes closed, resp even and unlabored

## 2016-04-13 NOTE — ED Notes (Signed)
Pt given breakfast tray, ate 100%

## 2016-04-14 LAB — BASIC METABOLIC PANEL
Anion gap: 6 (ref 5–15)
BUN: 13 mg/dL (ref 6–20)
CO2: 27 mmol/L (ref 22–32)
CREATININE: 0.72 mg/dL (ref 0.61–1.24)
Calcium: 8.7 mg/dL — ABNORMAL LOW (ref 8.9–10.3)
Chloride: 107 mmol/L (ref 101–111)
GFR calc Af Amer: >60 mL/min (ref >60)
GLUCOSE: 82 mg/dL (ref 65–99)
Potassium: 3.2 mmol/L — ABNORMAL LOW (ref 3.5–5.1)
SODIUM: 140 mmol/L (ref 135–145)

## 2016-04-14 LAB — GLUCOSE, CAPILLARY
GLUCOSE-CAPILLARY: 155 mg/dL — AB (ref 65–99)
Glucose-Capillary: 160 mg/dL — ABNORMAL HIGH (ref 65–99)
Glucose-Capillary: 73 mg/dL (ref 65–99)
Glucose-Capillary: 115 mg/dL — ABNORMAL HIGH (ref 65–99)

## 2016-04-14 LAB — LIPID PANEL
CHOLESTEROL: 150 mg/dL (ref 0–200)
HDL: 28 mg/dL — ABNORMAL LOW (ref >40)
LDL Cholesterol: 95 mg/dL (ref 0–99)
Total CHOL/HDL Ratio: 5.4 RATIO
Triglycerides: 137 mg/dL (ref <150)
VLDL: 27 mg/dL (ref 0–40)

## 2016-04-14 LAB — CBC
HCT: 40.3 % (ref 40.0–52.0)
HEMOGLOBIN: 13.8 g/dL (ref 13.0–18.0)
MCH: 33.6 pg (ref 26.0–34.0)
MCHC: 34.4 g/dL (ref 32.0–36.0)
MCV: 97.9 fL (ref 80.0–100.0)
PLATELETS: 178 10*3/uL (ref 150–440)
RBC: 4.12 MIL/uL — AB (ref 4.40–5.90)
RDW: 13.7 % (ref 11.5–14.5)
WBC: 6.9 10*3/uL (ref 3.8–10.6)

## 2016-04-14 LAB — TROPONIN I: TROPONIN I: 0.32 ng/mL — AB (ref <0.03)

## 2016-04-14 MED ORDER — CARVEDILOL 3.125 MG PO TABS
3.1250 mg | ORAL_TABLET | Freq: Two times a day (BID) | ORAL | Status: DC
  Administered 2016-04-14: 3.125 mg via ORAL
  Filled 2016-04-14: qty 1

## 2016-04-14 MED ORDER — MUPIROCIN 2 % EX OINT: 1.0000 "application " | TOPICAL_OINTMENT | Freq: Two times a day (BID) | CUTANEOUS | 0 refills | Status: DC

## 2016-04-14 MED ORDER — RIVAROXABAN 15 MG PO TABS: 15.0000 mg | ORAL_TABLET | Freq: Every day | ORAL | Status: DC

## 2016-04-14 MED ORDER — FUROSEMIDE 20 MG PO TABS
20.0000 mg | ORAL_TABLET | Freq: Every day | ORAL | Status: DC
  Administered 2016-04-14: 20 mg via ORAL
  Filled 2016-04-14: qty 1

## 2016-04-14 MED ORDER — ISOSORBIDE MONONITRATE ER 30 MG PO TB24: 30.0000 mg | ORAL_TABLET | Freq: Every day | ORAL | 0 refills | Status: AC

## 2016-04-14 NOTE — Progress Notes (Signed)
[facility]  SUBJECTIVE: no chest pain or sob   Vitals:   04/13/16 1816 04/13/16 2014 04/14/16 0321 04/14/16 0805  BP:  104/61 98/61 100/61  Pulse:  82 77 82  Resp:  18 20 18   Temp:  98 F (36.7 C) 97.7 F (36.5 C) 97.7 F (36.5 C)  TempSrc:  Oral Oral Oral  SpO2: 94% 94% 92% 92%  Weight:      Height:        Intake/Output Summary (Last 24 hours) at 04/14/16 0831 Last data filed at 04/14/16 0806  Gross per 24 hour  Intake              240 ml  Output             3300 ml  Net            -3060 ml    LABS: Basic Metabolic Panel:  Recent Labs  04/13/16 0147 04/14/16 0459  NA 135 140  K 4.0 3.2*  CL 105 107  CO2 21* 27  GLUCOSE 285* 82  BUN 21* 13  CREATININE 0.84 0.72  CALCIUM 8.8* 8.7*   Liver Function Tests: No results for input(s): AST, ALT, ALKPHOS, BILITOT, PROT, ALBUMIN in the last 72 hours. No results for input(s): LIPASE, AMYLASE in the last 72 hours. CBC:  Recent Labs  04/13/16 0147 04/14/16 0459  WBC 11.5* 6.9  HGB 14.5 13.8  HCT 43.0 40.3  MCV 97.8 97.9  PLT 186 178   Cardiac Enzymes:  Recent Labs  04/13/16 1708 04/13/16 2245 04/14/16 0459  TROPONINI 0.37* 0.30* 0.32*   BNP: Invalid input(s): POCBNP D-Dimer: No results for input(s): DDIMER in the last 72 hours. Hemoglobin A1C: No results for input(s): HGBA1C in the last 72 hours. Fasting Lipid Panel:  Recent Labs  04/14/16 0459  CHOL 150  HDL 28*  LDLCALC 95  TRIG 137  CHOLHDL 5.4   Thyroid Function Tests: No results for input(s): TSH, T4TOTAL, T3FREE, THYROIDAB in the last 72 hours.  Invalid input(s): FREET3 Anemia Panel: No results for input(s): VITAMINB12, FOLATE, FERRITIN, TIBC, IRON, RETICCTPCT in the last 72 hours.   Physical Exam: Blood pressure 100/61, pulse 82, temperature 97.7 F (36.5 C), temperature source Oral, resp. rate 18, height 6' (1.829 m), weight 173 lb (78.5 kg), SpO2 92 %.   Wt Readings from Last 1  Encounters:  04/13/16 173 lb (78.5 kg)     General appearance: alert and cooperative Resp: clear to auscultation bilaterally Cardio: regular rate and rhythm GI: soft, non-tender; bowel sounds normal; no masses,  no organomegaly Extremities: extremities normal, atraumatic, no cyanosis or edema Neurologic: Grossly normal  TELEMETRY: Reviewed telemetry pt in nsr:  ASSESSMENT AND PLAN:  Principal Problem:   Chest pain-mild troponin elevation. Likely demand ischemia. Not candidate for high dose statin. Not candidate for phase 2 cardiac rehab. WIll treat medically with low dose carvedilol at 3.125 mg bid, spironolactone 12.5 mg daily, imdur 30 mg daily, lasix 20 mg daily , asa 81 mg daily.   Cardiomyopathy-no clinical evidence of chf. Will continiue spironolactone 12.5 mg dialy, lasix 20 mg daily, carvedolol 3.125 mg bid  dvt-resume xarelto 20 mg daily.   OK for discharge on aforementioned meds with follow up per Dr. Nehemiah Massed.      Teodoro Spray, MD, [facility] 04/14/2016 8:31 AM

## 2016-04-14 NOTE — Care Management Important Message (Signed)
Important Message  Patient Details  Name: Ruben Gallegos MRN: [id number] Date of Birth: 03-29-1921   Medicare Important Message Given:  Other (see comment)  An  unsigned IM scanned in to Epic  to be mailed to patient.  CM was planning on having patient sign  an IM while inpatient but he was discharged today before CM could present the document and obtain signature    Katrina Stack, RN 04/14/2016, 2:04 PM

## 2016-04-14 NOTE — Progress Notes (Signed)
Patient is being discharged home today, discharge instruction provided, iv removed son at bedside. Patient discharge home as per order

## 2016-04-16 NOTE — Discharge Summary (Signed)
Ruben Gallegos, is a 83 y.o. male  DOB [birth year]  MRN [id number].  Admission date:  04/13/2016  Admitting Physician  Saundra Shelling, MD  Discharge Date:  04/14/2016   Primary MD  [employer], MD  Recommendations for primary care physician for things to follow:  Follow with prrimary doctor in 1 week/ Follow-up with primary cardiologist in 2 weeks   Admission Diagnosis  ACS (acute coronary syndrome) ([place]) [I24.9] DVT (deep vein thrombosis) in pregnancy ([place]) [O22.30, I82.409] Swelling of left lower extremity [M79.89] Chest pain [R07.9] Chest pain due to myocardial ischemia, unspecified ischemic chest pain type ([place]) [I20.9]   Discharge Diagnosis  ACS (acute coronary syndrome) ([place]) [I24.9] DVT (deep vein thrombosis) in pregnancy ([place]) [O22.30, I82.409] Swelling of left lower extremity [M79.89] Chest pain [R07.9] Chest pain due to myocardial ischemia, unspecified ischemic chest pain type ([place]) [I20.9]    Principal Problem:   Chest pain      Past Medical History:  Diagnosis Date  . Cancer ([place])   . CHF (congestive heart failure) ([place])   . Diabetes mellitus without complication ([place])   . GERD (gastroesophageal reflux disease)   . HLD (hyperlipidemia)   . Hypertension   . Hypothyroidism     Past Surgical History:  Procedure Laterality Date  . APPENDECTOMY    . CATARACT EXTRACTION    . HERNIA REPAIR         History of present illness and  Hospital Course:     Kindly see H&P for history of present illness and admission details, please review complete Labs, Consult reports and Test reports for all details in brief      Hospital Course  83 year old male patient with history of coronary artery disease status post PCI came in because of chest pain on the left side, admitted because of chest pain,  admitted to telemetry. Initial troponin 0.12. EKG didn't show any new changes. Patient has chronic systolic heart failure EF 15-20%. Patient troponin. Dr. Durwin Reges. Seen by cardiologist. Patient is very functional 83 year old and he lives by himself in an apartment. Patient's son is not interested in any symptoms. Cardiology added Imdur to his medication regimen. Patient also has Xarelto for history of DVT. And the patient remained chest pain-free following day, we advised the son and the patient to continue Coreg, spironolactone, low-dose Imdur has been added, continue the Lasix at discharge. He did not have any evidence of CHF.  2.DMII;continue lantus,glipizide.   Discharge Condition: stable   Follow UP  Follow-up Information    Corey Skains, MD. Go on 04/25/2016.   Specialty:  Cardiology Why:  Time: 3:45 p.m. Contact information: [address] [facility] [place] [address] Catherine, MD. Go on 04/21/2016.   Specialty:  Internal Medicine Why:  Time: 11:15 a.m. Contact information: [place] [address] [phone]             Discharge Instructions  and  Discharge Medications     Allergies as of 04/14/2016      Reactions   Altace [ramipril] Shortness Of Breath   Tramadol Shortness Of Breath   Actos [pioglitazone] Swelling   Codeine Nausea Only   Demerol [meperidine] Nausea Only   Lipitor [atorvastatin] Other (See Comments)   Metformin And Related Other (See Comments)   Mevacor [lovastatin] Other (See Comments)      Medication List  TAKE these medications   acetaminophen 325 MG tablet Commonly known as:  TYLENOL Take 325 mg by mouth 3 (three) times daily.   carvedilol 3.125 MG tablet Commonly known as:  COREG Take 3.125 mg by mouth 2 (two) times daily.   esomeprazole 40 MG capsule Commonly known as:  NEXIUM Take 40 mg by mouth daily.   fluvastatin 40  MG capsule Commonly known as:  LESCOL Take 40 mg by mouth at bedtime.   furosemide 40 MG tablet Commonly known as:  LASIX Take 1 tablet (40 mg total) by mouth daily.   glipiZIDE 10 MG tablet Commonly known as:  GLUCOTROL Take 10 mg by mouth 2 (two) times daily.   insulin glargine 100 UNIT/ML injection Commonly known as:  LANTUS Inject 26 Units into the skin at bedtime.   insulin lispro 100 UNIT/ML injection Commonly known as:  HUMALOG Inject into the skin See admin instructions. Per sliding scale   isosorbide mononitrate 30 MG 24 hr tablet Commonly known as:  IMDUR Take 1 tablet (30 mg total) by mouth daily.   levothyroxine 100 MCG tablet Commonly known as:  SYNTHROID, LEVOTHROID Take 100 mcg by mouth daily.   mirtazapine 7.5 MG tablet Commonly known as:  REMERON Take 7.5 mg by mouth daily.   mupirocin ointment 2 % Commonly known as:  BACTROBAN Place 1 application into the nose 2 (two) times daily.   nitroGLYCERIN 0.4 MG SL tablet Commonly known as:  NITROSTAT Place 0.4 mg under the tongue every 5 (five) minutes as needed for chest pain.   potassium chloride 10 MEQ tablet Commonly known as:  K-DUR Take 10 mEq by mouth daily.   spironolactone 25 MG tablet Commonly known as:  ALDACTONE Take 12.5 mg by mouth daily.   Vitamin D-3 1000 units Caps Take 1,000 Units by mouth daily.   XARELTO 20 MG Tabs tablet Generic drug:  rivaroxaban Take 20 mg by mouth daily.         Diet and Activity recommendation: See Discharge Instructions above   Consults obtained - cardiology   Major procedures and Radiology Reports - PLEASE review detailed and final reports for all details, in brief      Dg Chest 1 View  Result Date: 04/13/2016 CLINICAL DATA:  Chest pain for 1 hour. EXAM: CHEST 1 VIEW COMPARISON:  CT chest 08/24/2015.  Chest 08/24/2015. FINDINGS: Shallow inspiration with elevation of the right hemidiaphragm. Mild cardiac enlargement without vascular  congestion or edema. Fibrosis or linear atelectasis in the lung bases. No focal consolidation. No blunting of costophrenic angles. No pneumothorax. Calcified and tortuous aorta. IMPRESSION: Elevation of the right hemidiaphragm. Linear atelectasis or fibrosis in the lung bases. Cardiac enlargement. No evidence of active consolidation. Electronically Signed   By: Lucienne Capers M.D.   On: 04/13/2016 02:07   US Venous Img Lower Unilateral Left  Result Date: 04/13/2016 CLINICAL DATA:  Left lower extremity edema EXAM: LEFT LOWER EXTREMITY VENOUS DUPLEX ULTRASOUND TECHNIQUE: Gray-scale sonography with graded compression, as well as color Doppler and duplex ultrasound were performed to evaluate the left lower extremity deep venous system from the level of the common femoral vein and including the common femoral, femoral, profunda femoral, popliteal and calf veins including the posterior tibial, peroneal and gastrocnemius veins when visible. The superficial great saphenous vein was also interrogated. Spectral Doppler was utilized to evaluate flow at rest and with distal augmentation maneuvers in the common femoral, femoral and popliteal veins. COMPARISON:  None. FINDINGS: Contralateral Common Femoral Vein:  Respiratory phasicity is normal and symmetric with the symptomatic side. No evidence of thrombus. Normal compressibility. Common Femoral Vein: No evidence of thrombus. Normal compressibility, respiratory phasicity and response to augmentation. Saphenofemoral Junction: No evidence of thrombus. Normal compressibility and flow on color Doppler imaging. Profunda Femoral Vein: No evidence of thrombus. Normal compressibility and flow on color Doppler imaging. Femoral Vein: No evidence of thrombus. Normal compressibility, respiratory phasicity and response to augmentation. Popliteal Vein: No evidence of thrombus. Normal compressibility, respiratory phasicity and response to augmentation. Calf Veins: No evidence of  thrombus. Normal compressibility and flow on color Doppler imaging. Superficial Great Saphenous Vein: No evidence of thrombus. Normal compressibility and flow on color Doppler imaging. Venous Reflux:  None. Other Findings:  None. IMPRESSION: No evidence of left lower extremity deep venous thrombosis. Right common femoral vein also patent. Electronically Signed   By: Lowella Grip III M.D.   On: 04/13/2016 09:00    Micro Results     Recent Results (from the past 240 hour(s))  MRSA PCR Screening     Status: Abnormal   Collection Time: 04/13/16  4:56 PM  Result Value Ref Range Status   MRSA by PCR POSITIVE (A) NEGATIVE Final    Comment:        The GeneXpert MRSA Assay (FDA approved for NASAL specimens only), is one component of a comprehensive MRSA colonization surveillance program. It is not intended to diagnose MRSA infection nor to guide or monitor treatment for MRSA infections. RESULT CALLED TO, READ BACK BY AND VERIFIED WITH: LEXI NOAH 04/13/16 1904 KLW        Today   Subjective:   Jaythan Craker today has no headache,no chest abdominal pain,no new weakness tingling or numbness, feels much better wants to go home today.   Objective:   Blood pressure (!) 97/56, pulse 79, temperature 97.5 F (36.4 C), temperature source Oral, resp. rate 20, height 6' (1.829 m), weight 78.5 kg (173 lb), SpO2 92 %.  No intake or output data in the 24 hours ending 04/16/16 1126  Exam Awake Alert, Oriented x 3, No new F.N deficits, Normal affect Britt.AT,PERRAL Supple Neck,No JVD, No cervical lymphadenopathy appriciated.  Symmetrical Chest wall movement, Good air movement bilaterally, CTAB RRR,No Gallops,Rubs or new Murmurs, No Parasternal Heave +ve B.Sounds, Abd Soft, Non tender, No organomegaly appriciated, No rebound -guarding or rigidity. No Cyanosis, Clubbing or edema, No new Rash or bruise  Data Review   CBC w Diff:  Lab Results  Component Value Date   WBC 6.9 04/14/2016    HGB 13.8 04/14/2016   HGB 13.1 03/04/2014   HCT 40.3 04/14/2016   HCT 39.4 (L) 03/04/2014   PLT 178 04/14/2016   PLT 191 03/04/2014   LYMPHOPCT 8% 08/24/2015   LYMPHOPCT 27.9 03/04/2014   MONOPCT 6% 08/24/2015   MONOPCT 13.1 03/04/2014   EOSPCT 1% 08/24/2015   EOSPCT 5.2 03/04/2014   BASOPCT 0% 08/24/2015   BASOPCT 0.9 03/04/2014    CMP:  Lab Results  Component Value Date   NA 140 04/14/2016   NA 138 03/04/2014   K 3.2 (L) 04/14/2016   K 3.7 03/04/2014   CL 107 04/14/2016   CL 104 03/04/2014   CO2 27 04/14/2016   CO2 26 03/04/2014   BUN 13 04/14/2016   BUN 16 03/04/2014   CREATININE 0.72 04/14/2016   CREATININE 0.85 03/04/2014   PROT 6.6 10/17/2013   ALBUMIN 2.4 (L) 10/17/2013   BILITOT 0.8 10/17/2013   ALKPHOS 60 10/17/2013  AST 26 10/17/2013   ALT 39 10/17/2013  .   Total Time in preparing paper work, data evaluation and todays exam - 97 minutes  Broughton Eppinger M.D on 04/16/2016 at 11:26 AM    Note: This dictation was prepared with Dragon dictation along with smaller phrase technology. Any transcriptional errors that result from this process are unintentional.

## 2017-06-07 DIAGNOSIS — I10 Essential (primary) hypertension: Secondary | ICD-10-CM

## 2017-06-07 HISTORY — DX: Essential (primary) hypertension: I10

## 2017-06-09 ENCOUNTER — Other Ambulatory Visit: Payer: Self-pay

## 2017-06-09 ENCOUNTER — Emergency Department: Payer: Medicare Other

## 2017-06-09 ENCOUNTER — Inpatient Hospital Stay
Admission: EM | Admit: 2017-06-09 | Discharge: 2017-06-15 | DRG: 871 | Disposition: A | Discharge status: Skilled Nursing Facility | Payer: Medicare Other | Attending: Internal Medicine | Admitting: Internal Medicine

## 2017-06-09 ENCOUNTER — Encounter
Admission: RE | Admit: 2017-06-09 | Discharge: 2017-06-09 | Disposition: A | Discharge status: Home / Self Care | Payer: Medicare Other | Source: Ambulatory Visit | Attending: Internal Medicine | Admitting: Internal Medicine

## 2017-06-09 DIAGNOSIS — E785 Hyperlipidemia, unspecified: Secondary | ICD-10-CM | POA: Diagnosis present

## 2017-06-09 DIAGNOSIS — I248 Other forms of acute ischemic heart disease: Secondary | ICD-10-CM | POA: Diagnosis present

## 2017-06-09 DIAGNOSIS — I482 Chronic atrial fibrillation: Secondary | ICD-10-CM | POA: Diagnosis present

## 2017-06-09 DIAGNOSIS — E11649 Type 2 diabetes mellitus with hypoglycemia without coma: Secondary | ICD-10-CM | POA: Diagnosis present

## 2017-06-09 DIAGNOSIS — R000 Tachycardia, unspecified: Secondary | ICD-10-CM | POA: Diagnosis present

## 2017-06-09 DIAGNOSIS — J690 Pneumonitis due to inhalation of food and vomit: Secondary | ICD-10-CM | POA: Diagnosis present

## 2017-06-09 DIAGNOSIS — Z515 Encounter for palliative care: Secondary | ICD-10-CM | POA: Diagnosis present

## 2017-06-09 DIAGNOSIS — K219 Gastro-esophageal reflux disease without esophagitis: Secondary | ICD-10-CM | POA: Diagnosis present

## 2017-06-09 DIAGNOSIS — R0600 Dyspnea, unspecified: Secondary | ICD-10-CM | POA: Diagnosis not present

## 2017-06-09 DIAGNOSIS — I959 Hypotension, unspecified: Secondary | ICD-10-CM | POA: Diagnosis present

## 2017-06-09 DIAGNOSIS — I4891 Unspecified atrial fibrillation: Secondary | ICD-10-CM | POA: Diagnosis present

## 2017-06-09 DIAGNOSIS — I5023 Acute on chronic systolic (congestive) heart failure: Secondary | ICD-10-CM | POA: Diagnosis present

## 2017-06-09 DIAGNOSIS — E039 Hypothyroidism, unspecified: Secondary | ICD-10-CM | POA: Diagnosis present

## 2017-06-09 DIAGNOSIS — R0602 Shortness of breath: Secondary | ICD-10-CM

## 2017-06-09 DIAGNOSIS — R627 Adult failure to thrive: Secondary | ICD-10-CM | POA: Diagnosis present

## 2017-06-09 DIAGNOSIS — R778 Other specified abnormalities of plasma proteins: Secondary | ICD-10-CM

## 2017-06-09 DIAGNOSIS — Z794 Long term (current) use of insulin: Secondary | ICD-10-CM

## 2017-06-09 DIAGNOSIS — J189 Pneumonia, unspecified organism: Secondary | ICD-10-CM | POA: Diagnosis not present

## 2017-06-09 DIAGNOSIS — Z79890 Hormone replacement therapy (postmenopausal): Secondary | ICD-10-CM

## 2017-06-09 DIAGNOSIS — Z833 Family history of diabetes mellitus: Secondary | ICD-10-CM | POA: Diagnosis not present

## 2017-06-09 DIAGNOSIS — Z7189 Other specified counseling: Secondary | ICD-10-CM | POA: Diagnosis not present

## 2017-06-09 DIAGNOSIS — Z7901 Long term (current) use of anticoagulants: Secondary | ICD-10-CM | POA: Diagnosis not present

## 2017-06-09 DIAGNOSIS — J9601 Acute respiratory failure with hypoxia: Secondary | ICD-10-CM | POA: Diagnosis present

## 2017-06-09 DIAGNOSIS — I110 Hypertensive heart disease with heart failure: Secondary | ICD-10-CM | POA: Diagnosis present

## 2017-06-09 DIAGNOSIS — I480 Paroxysmal atrial fibrillation: Secondary | ICD-10-CM | POA: Diagnosis not present

## 2017-06-09 DIAGNOSIS — Z885 Allergy status to narcotic agent status: Secondary | ICD-10-CM

## 2017-06-09 DIAGNOSIS — R05 Cough: Secondary | ICD-10-CM | POA: Diagnosis not present

## 2017-06-09 DIAGNOSIS — A419 Sepsis, unspecified organism: Secondary | ICD-10-CM | POA: Diagnosis present

## 2017-06-09 DIAGNOSIS — J449 Chronic obstructive pulmonary disease, unspecified: Secondary | ICD-10-CM | POA: Diagnosis present

## 2017-06-09 DIAGNOSIS — Z87891 Personal history of nicotine dependence: Secondary | ICD-10-CM

## 2017-06-09 DIAGNOSIS — J9801 Acute bronchospasm: Secondary | ICD-10-CM | POA: Diagnosis present

## 2017-06-09 DIAGNOSIS — Z86718 Personal history of other venous thrombosis and embolism: Secondary | ICD-10-CM

## 2017-06-09 DIAGNOSIS — Z79899 Other long term (current) drug therapy: Secondary | ICD-10-CM

## 2017-06-09 DIAGNOSIS — I447 Left bundle-branch block, unspecified: Secondary | ICD-10-CM | POA: Diagnosis present

## 2017-06-09 DIAGNOSIS — Z9849 Cataract extraction status, unspecified eye: Secondary | ICD-10-CM

## 2017-06-09 DIAGNOSIS — R0902 Hypoxemia: Secondary | ICD-10-CM | POA: Diagnosis present

## 2017-06-09 DIAGNOSIS — R053 Chronic cough: Secondary | ICD-10-CM

## 2017-06-09 DIAGNOSIS — R7989 Other specified abnormal findings of blood chemistry: Secondary | ICD-10-CM

## 2017-06-09 DIAGNOSIS — I2510 Atherosclerotic heart disease of native coronary artery without angina pectoris: Secondary | ICD-10-CM | POA: Diagnosis present

## 2017-06-09 DIAGNOSIS — Z888 Allergy status to other drugs, medicaments and biological substances status: Secondary | ICD-10-CM

## 2017-06-09 DIAGNOSIS — Z66 Do not resuscitate: Secondary | ICD-10-CM | POA: Diagnosis present

## 2017-06-09 DIAGNOSIS — I255 Ischemic cardiomyopathy: Secondary | ICD-10-CM | POA: Diagnosis present

## 2017-06-09 LAB — BLOOD GAS, VENOUS
ACID-BASE EXCESS: 1 mmol/L (ref 0.0–2.0)
Bicarbonate: 25.2 mmol/L (ref 20.0–28.0)
O2 SAT: 89.1 %
PATIENT TEMPERATURE: 37
PCO2 VEN: 38 mmHg — AB (ref 44.0–60.0)
PO2 VEN: 55 mmHg — AB (ref 32.0–45.0)
pH, Ven: 7.43 (ref 7.250–7.430)

## 2017-06-09 LAB — URINALYSIS, COMPLETE (UACMP) WITH MICROSCOPIC
Bacteria, UA: NONE SEEN
Bilirubin Urine: NEGATIVE
GLUCOSE, UA: NEGATIVE mg/dL
HGB URINE DIPSTICK: NEGATIVE
KETONES UR: NEGATIVE mg/dL
LEUKOCYTES UA: NEGATIVE
Nitrite: NEGATIVE
Protein, ur: NEGATIVE mg/dL
SQUAMOUS EPITHELIAL / LPF: NONE SEEN
Specific Gravity, Urine: 1.013 (ref 1.005–1.030)
pH: 5 (ref 5.0–8.0)

## 2017-06-09 LAB — INFLUENZA PANEL BY PCR (TYPE A & B)
Influenza A By PCR: NEGATIVE
Influenza B By PCR: NEGATIVE

## 2017-06-09 LAB — CBC WITH DIFFERENTIAL/PLATELET
Basophils Absolute: 0 10*3/uL (ref 0–0.1)
Basophils Relative: 0 %
EOS PCT: 0 %
Eosinophils Absolute: 0 10*3/uL (ref 0–0.7)
HEMATOCRIT: 44.5 % (ref 40.0–52.0)
Hemoglobin: 14.8 g/dL (ref 13.0–18.0)
LYMPHS ABS: 0.6 10*3/uL — AB (ref 1.0–3.6)
Lymphocytes Relative: 13 %
MCH: 31.9 pg (ref 26.0–34.0)
MCHC: 33.2 g/dL (ref 32.0–36.0)
MCV: 96 fL (ref 80.0–100.0)
MONO ABS: 0.6 10*3/uL (ref 0.2–1.0)
MONOS PCT: 12 %
Neutro Abs: 3.8 10*3/uL (ref 1.4–6.5)
Neutrophils Relative %: 75 %
PLATELETS: 185 10*3/uL (ref 150–440)
RBC: 4.64 MIL/uL (ref 4.40–5.90)
RDW: 14 % (ref 11.5–14.5)
WBC: 5.1 10*3/uL (ref 3.8–10.6)

## 2017-06-09 LAB — PROCALCITONIN: Procalcitonin: <0.1 ng/mL

## 2017-06-09 LAB — EXPECTORATED SPUTUM ASSESSMENT W GRAM STAIN, RFLX TO RESP C

## 2017-06-09 LAB — LACTIC ACID, PLASMA
LACTIC ACID, VENOUS: 1.4 mmol/L (ref 0.5–1.9)
Lactic Acid, Venous: 1.1 mmol/L (ref 0.5–1.9)

## 2017-06-09 LAB — COMPREHENSIVE METABOLIC PANEL
ALT: 18 U/L (ref 17–63)
AST: 29 U/L (ref 15–41)
Albumin: 3.4 g/dL — ABNORMAL LOW (ref 3.5–5.0)
Alkaline Phosphatase: 60 U/L (ref 38–126)
Anion gap: 10 (ref 5–15)
BUN: 19 mg/dL (ref 6–20)
CHLORIDE: 102 mmol/L (ref 101–111)
CO2: 23 mmol/L (ref 22–32)
Calcium: 8.4 mg/dL — ABNORMAL LOW (ref 8.9–10.3)
Creatinine, Ser: 1.09 mg/dL (ref 0.61–1.24)
GFR, EST NON AFRICAN AMERICAN: 56 mL/min — AB (ref >60)
Glucose, Bld: 186 mg/dL — ABNORMAL HIGH (ref 65–99)
POTASSIUM: 3.8 mmol/L (ref 3.5–5.1)
SODIUM: 135 mmol/L (ref 135–145)
Total Bilirubin: 1.1 mg/dL (ref 0.3–1.2)
Total Protein: 7.7 g/dL (ref 6.5–8.1)

## 2017-06-09 LAB — EXPECTORATED SPUTUM ASSESSMENT W REFEX TO RESP CULTURE

## 2017-06-09 LAB — GLUCOSE, CAPILLARY
Glucose-Capillary: 141 mg/dL — ABNORMAL HIGH (ref 65–99)
Glucose-Capillary: 119 mg/dL — ABNORMAL HIGH (ref 65–99)

## 2017-06-09 LAB — TROPONIN I: TROPONIN I: 0.15 ng/mL — AB (ref <0.03)

## 2017-06-09 LAB — MRSA PCR SCREENING: MRSA by PCR: NEGATIVE

## 2017-06-09 MED ORDER — MUPIROCIN 2 % EX OINT
1.0000 "application " | TOPICAL_OINTMENT | Freq: Two times a day (BID) | CUTANEOUS | Status: DC | PRN
  Filled 2017-06-09: qty 22

## 2017-06-09 MED ORDER — SODIUM CHLORIDE 0.9 % IV SOLN
1.0000 g | Freq: Three times a day (TID) | INTRAVENOUS | Status: DC
  Administered 2017-06-09: 1 g via INTRAVENOUS
  Filled 2017-06-09 (×3): qty 1

## 2017-06-09 MED ORDER — MIRTAZAPINE 15 MG PO TABS
7.5000 mg | ORAL_TABLET | Freq: Every day | ORAL | Status: DC
  Administered 2017-06-09 – 2017-06-15 (×7): 7.5 mg via ORAL
  Filled 2017-06-09 (×7): qty 1

## 2017-06-09 MED ORDER — VANCOMYCIN HCL IN DEXTROSE 1-5 GM/200ML-% IV SOLN
1000.0000 mg | Freq: Once | INTRAVENOUS | Status: AC
  Administered 2017-06-09: 1000 mg via INTRAVENOUS
  Filled 2017-06-09: qty 200

## 2017-06-09 MED ORDER — VITAMIN D 1000 UNITS PO TABS
1000.0000 [IU] | ORAL_TABLET | Freq: Every day | ORAL | Status: DC
  Administered 2017-06-10 – 2017-06-15 (×6): 1000 [IU] via ORAL
  Filled 2017-06-09 (×6): qty 1

## 2017-06-09 MED ORDER — VANCOMYCIN HCL 10 G IV SOLR
1250.0000 mg | INTRAVENOUS | Status: DC
  Administered 2017-06-09: 1250 mg via INTRAVENOUS
  Filled 2017-06-09: qty 1250

## 2017-06-09 MED ORDER — ASPIRIN 81 MG PO CHEW
324.0000 mg | CHEWABLE_TABLET | Freq: Once | ORAL | Status: AC
  Administered 2017-06-09: 324 mg via ORAL
  Filled 2017-06-09: qty 4

## 2017-06-09 MED ORDER — INSULIN ASPART 100 UNIT/ML ~~LOC~~ SOLN
0.0000 [IU] | Freq: Three times a day (TID) | SUBCUTANEOUS | Status: DC
  Administered 2017-06-09: 1 [IU] via SUBCUTANEOUS
  Administered 2017-06-10: 5 [IU] via SUBCUTANEOUS
  Administered 2017-06-10: 2 [IU] via SUBCUTANEOUS
  Administered 2017-06-11: 5 [IU] via SUBCUTANEOUS
  Filled 2017-06-09 (×4): qty 1

## 2017-06-09 MED ORDER — SODIUM CHLORIDE 0.9 % IV SOLN
2.0000 g | Freq: Once | INTRAVENOUS | Status: AC
  Administered 2017-06-09: 2 g via INTRAVENOUS
  Filled 2017-06-09: qty 2

## 2017-06-09 MED ORDER — ACETAMINOPHEN 325 MG PO TABS
325.0000 mg | ORAL_TABLET | Freq: Three times a day (TID) | ORAL | Status: DC | PRN
  Administered 2017-06-09 – 2017-06-12 (×2): 325 mg via ORAL
  Filled 2017-06-09 (×2): qty 1

## 2017-06-09 MED ORDER — SODIUM CHLORIDE 0.9 % IV SOLN: 1.0000 g | Freq: Two times a day (BID) | INTRAVENOUS | Status: DC

## 2017-06-09 MED ORDER — NITROGLYCERIN 0.4 MG SL SUBL
0.4000 mg | SUBLINGUAL_TABLET | SUBLINGUAL | Status: DC | PRN
  Administered 2017-06-12: 0.4 mg via SUBLINGUAL
  Filled 2017-06-09: qty 1

## 2017-06-09 MED ORDER — SODIUM CHLORIDE 0.9 % IV SOLN
2.0000 g | Freq: Two times a day (BID) | INTRAVENOUS | Status: DC
  Administered 2017-06-09 – 2017-06-12 (×6): 2 g via INTRAVENOUS
  Filled 2017-06-09 (×8): qty 2

## 2017-06-09 MED ORDER — RIVAROXABAN 20 MG PO TABS
20.0000 mg | ORAL_TABLET | Freq: Every day | ORAL | Status: DC
  Administered 2017-06-09 – 2017-06-15 (×7): 20 mg via ORAL
  Filled 2017-06-09 (×7): qty 1

## 2017-06-09 MED ORDER — SODIUM CHLORIDE 0.9 % IV BOLUS (SEPSIS)
500.0000 mL | Freq: Once | INTRAVENOUS | Status: AC
  Administered 2017-06-09: 500 mL via INTRAVENOUS

## 2017-06-09 MED ORDER — INSULIN ASPART 100 UNIT/ML ~~LOC~~ SOLN
0.0000 [IU] | Freq: Every day | SUBCUTANEOUS | Status: DC
  Administered 2017-06-10: 5 [IU] via SUBCUTANEOUS
  Administered 2017-06-11: 2 [IU] via SUBCUTANEOUS
  Administered 2017-06-12: 3 [IU] via SUBCUTANEOUS
  Filled 2017-06-09 (×3): qty 1

## 2017-06-09 MED ORDER — LEVOTHYROXINE SODIUM 100 MCG PO TABS
100.0000 ug | ORAL_TABLET | Freq: Every day | ORAL | Status: DC
  Administered 2017-06-10 – 2017-06-15 (×6): 100 ug via ORAL
  Filled 2017-06-09 (×6): qty 1

## 2017-06-09 MED ORDER — SODIUM CHLORIDE 0.9 % IV BOLUS (SEPSIS)
1000.0000 mL | INTRAVENOUS | Status: AC
  Administered 2017-06-09: 1000 mL via INTRAVENOUS

## 2017-06-09 MED ORDER — PANTOPRAZOLE SODIUM 40 MG PO TBEC
40.0000 mg | DELAYED_RELEASE_TABLET | Freq: Every day | ORAL | Status: DC
  Administered 2017-06-10 – 2017-06-15 (×6): 40 mg via ORAL
  Filled 2017-06-09 (×6): qty 1

## 2017-06-09 NOTE — Progress Notes (Signed)
Inpatient Diabetes Program Recommendations  AACE/ADA: New Consensus Statement on Inpatient Glycemic Control (2015)  Target Ranges:  Prepandial:   less than 140 mg/dL      Peak postprandial:   less than 180 mg/dL (1-2 hours)      Critically ill patients:  140 - 180 mg/dL   Results for Ruben Gallegos, Ruben Gallegos (MRN [id number]) as of 06/09/2017 14:06  Ref. Range 06/09/2017 10:20  Glucose Latest Ref Range: 65 - 99 mg/dL 186 (H)     Home DM Meds: Lantus 12 units AM/ 26 units PM        Glipizide 10 mg daily   Current Orders: Novolog Sensitive Correction Scale/ SSI (0-9 units) TID AC + HS     MD- Note Novolog SSI to start tonight at 5pm.  Agree with current orders.  If you see that pt's fasting AM CBG tomorrow is elevated, please consider starting 50% total home dose of Lantus:  Lantus 6 units AM/ 13 units QHS     --Will follow patient during hospitalization--  Wyn Quaker RN, MSN, CDE Diabetes Coordinator Inpatient Glycemic Control Team Team Pager: [phone] (8a-5p)

## 2017-06-09 NOTE — Clinical Social Work Note (Signed)
Clinical Social Work Assessment  Patient Details  Name: Ruben Gallegos MRN: [id number] Date of Birth: Oct 22, 1920  Date of referral:  06/09/17               Reason for consult:  Other (Comment Required)(From a facility [facility])                Permission sought to share information with:  Family Supports, Chartered certified accountant granted to share information::  Yes, Verbal Permission Granted  Name::     Both sons HCPOA and POA Ruben Gallegos and Ruben Gallegos  [facility] # [phone]) Ruben Gallegos [phone]  Agency::     Relationship::     Contact Information:  [facility]  Housing/Transportation Living arrangements for the past 2 months:  Apartment(Independent Living) Source of Information:  Patient, Adult Children Patient Interpreter Needed:  None Criminal Activity/Legal Involvement Pertinent to Current Situation/Hospitalization:  No - Comment as needed Significant Relationships:  Adult Children, Church, [employer], Other Family Members Lives with:  Self Do you feel safe going back to the place where you live?  Yes Need for family participation in patient care:  No (Coment)  Care giving concerns:  Family wishes patient to go to [facility] when patient DC from hospital  ( Pt from [facility] Worker assessment / plan:  LCSW introduced myself to patient and his family and obtained verbal consent. He is resident of [facility] and has a helper in from 7-10am and 8-10 pm every day to assist with activities of daily living. He is well spoken and oriented x4. He has exceptional family support and its the family plan for patient to go to [facility] once discharged. Sharyn Lull aware pt here)Ruben Gallegos  is a 84 y.o. male with a known history of diabetes mellitus, hypertension, hypothyroidism, hyperlipidemia, chronic congestive heart failure is presenting to the ED with a chief complaint of shortness of breath, cough and congestion.  Denies any chest pain.   Patient was found to be hypoxic with 87% pulse ox on room air and 94% on 2 L, EKG with sinus tachycardia at 100 but lactic acid is normal.  Chest x-ray has revealed right lower lobe pneumonia.  Both sons are HCPOA. Patient is pretty independent with his ADLs and is oriented x4. He has [employer]/medicare.     Employment status:  Retired Forensic scientist:  Medicare([employer] ) PT Recommendations:    Information / Referral to community resources:     Patient/Family's Response to care:  Good understanding of care needed  Patient/Family's Understanding of and Emotional Response to Diagnosis, Current Treatment, and Prognosis:  Patient has positive attitude and excellent understanding of his treatment plan.  Emotional Assessment Appearance:  Appears stated age Attitude/Demeanor/Rapport:  Self-Confident, Engaged Affect (typically observed):  Accepting, Calm Orientation:  Oriented to Self, Oriented to  Time, Oriented to Place, Oriented to Situation Alcohol / Substance use:  Not Applicable Psych involvement (Current and /or in the community):  No (Comment)  Discharge Needs  Concerns to be addressed:  No discharge needs identified Readmission within the last 30 days:  No Current discharge risk:  None Barriers to Discharge:  No Barriers Identified   Joana Reamer, LCSW 06/09/2017, 3:37 PM

## 2017-06-09 NOTE — NC FL2 (Signed)
  [place] LEVEL OF CARE SCREENING TOOL     IDENTIFICATION  Patient Name: Ruben Gallegos Birthdate: 1920-11-12 Sex: male Admission Date (Current Location): 06/09/2017  [place] and [place] Number:  Engineering geologist and Address:  [facility], [address], [place], [place] [address]      Provider Number: [id number]  Attending Physician Name and Address:  [name], MD  Relative Name and Phone Number:       Current Level of Care: Other (Comment)([facility]) Recommended Level of Care: [place] Prior Approval Number:    Date Approved/Denied:   PASRR Number:  [id number]   Discharge Plan: SNF    Current Diagnoses: Patient Active Problem List   Diagnosis Date Noted  . PNA (pneumonia) 06/09/2017  . Chest pain 04/13/2016  . HLD (hyperlipidemia) 10/15/2015  . Acne erythematosa 10/15/2015  . Diabetes ([place]) 08/25/2015  . HTN (hypertension) 08/25/2015  . Hypothyroidism 08/25/2015  . Acute on chronic systolic CHF (congestive heart failure) ([place]) 08/25/2015  . GERD (gastroesophageal reflux disease) 08/25/2015  . Arteriosclerosis of coronary artery 08/10/2015  . Beat, premature ventricular 08/10/2015  . Paroxysmal supraventricular tachycardia ([place]) 05/21/2015  . H/O deep venous thrombosis 03/23/2015  . Healed or old pulmonary embolism 03/23/2015  . Edema leg 03/23/2015    Orientation RESPIRATION BLADDER Height & Weight     Self, Time, Situation, Place  O2 Continent Weight: 180 lb (81.6 kg) Height:  6\' 1"  (185.4 cm)  BEHAVIORAL SYMPTOMS/MOOD NEUROLOGICAL BOWEL NUTRITION STATUS      Continent (S) Diet(Diabetic)  AMBULATORY STATUS COMMUNICATION OF NEEDS Skin   Supervision Verbally Normal                       Personal Care Assistance Level of Assistance  Bathing, Feeding, Dressing, Total care Bathing Assistance: Limited assistance Feeding assistance: Independent Dressing Assistance: Limited  assistance Total Care Assistance: Limited assistance   Functional Limitations Info  Sight, Hearing, Speech Sight Info: Adequate Hearing Info: Adequate Speech Info: Adequate    SPECIAL CARE FACTORS FREQUENCY  PT (By licensed PT), OT (By licensed OT)     PT Frequency: x5 OT Frequency: x5            Contractures Contractures Info: Not present    Additional Factors Info  Allergies   Allergies Info: Altace Ramipril, Tramadol, Actos Pioglitazone, Codeine, Demerol Meperidine, Lipitor Atorvastatin, Metformin And Related, Mevacor Lovastatin           Current Medications (06/09/2017):  This is the current hospital active medication list Current Facility-Administered Medications  Medication Dose Route Frequency Provider Last Rate Last Dose  . ceFEPIme (MAXIPIME) 2 g in sodium chloride 0.9 % 100 mL IVPB  2 g Intravenous Q12H Gouru, Aruna, MD      . insulin aspart (novoLOG) injection 0-5 Units  0-5 Units Subcutaneous QHS Gouru, Aruna, MD      . insulin aspart (novoLOG) injection 0-9 Units  0-9 Units Subcutaneous TID WC Gouru, Aruna, MD      . vancomycin (VANCOCIN) 1,250 mg in sodium chloride 0.9 % 250 mL IVPB  1,250 mg Intravenous Q24H Gouru, Illene Silver, MD         Discharge Medications: Please see discharge summary for a list of discharge medications.  Relevant Imaging Results:  Relevant Lab Results:   Additional Information [id number]  Joana Reamer,

## 2017-06-09 NOTE — Progress Notes (Signed)
Pharmacy Antibiotic Note  Ruben Gallegos is a 84 y.o. male admitted on 06/09/2017 with pneumonia.  Pharmacy has been consulted for vancomycin/cefepime dosing. Patient received cefepime and vancomycin in ED.   Plan: Cefepime 1g IV Q12hr.   Vancomycin 1250mg  IV Q24hr for goal trough of 15-20. Will obtain MRSA PCR. Will obtain trough as clinically indicated.   Height: 6\' 1"  (185.4 cm) Weight: 180 lb (81.6 kg) IBW/kg (Calculated) : 79.9  Temp (24hrs), Avg:99.2 F (37.3 C), Min:99.2 F (37.3 C), Max:99.2 F (37.3 C)  Recent Labs  Lab 06/09/17 1020 06/09/17 1057  WBC 5.1  --   CREATININE 1.09  --   LATICACIDVEN  --  1.4    Estimated Creatinine Clearance: 46.8 mL/min (by C-G formula based on SCr of 1.09 mg/dL).    Allergies  Allergen Reactions  . Altace [Ramipril] Shortness Of Breath  . Tramadol Shortness Of Breath  . Actos [Pioglitazone] Swelling  . Codeine Nausea Only  . Demerol [Meperidine] Nausea Only  . Lipitor [Atorvastatin] Other (See Comments)  . Metformin And Related Other (See Comments)  . Mevacor [Lovastatin] Other (See Comments)    Antimicrobials this admission: Vancomycin 3/8 >>  Cefepime 3/8 >>   Dose adjustments this admission: N/A Microbiology results: 3/8 BCx: pending  3/8 UCx: pending  3/8 MRSA PCR: pending   Thank you for allowing pharmacy to be a part of this patient's care.  Agamjot Kilgallon L 06/09/2017 2:11 PM

## 2017-06-09 NOTE — Progress Notes (Signed)
Family Meeting Note  Advance Directive:yes  Today a meeting took place with the Patient, 2 sons and daughter-in-law  at bed side    The following clinical team members were present during this meeting:MD  The following were discussed:Patient's diagnosis: Sepsis, right lower lobe pneumonia, hypertension, diabetes mellitus, COPD chronic  Patient's progosis: Unable to determine and Goals for treatment: DNR RN may pronounce, both sons at bedside are his healthcare power of attorney.  Plan of care discussed in detail with the patient and his family mom was at bedside  Additional follow-up to be provided: Hospitalist  Time spent during discussion:18 min  [name], MD

## 2017-06-09 NOTE — ED Notes (Signed)
Date and time results received: 06/09/17 1100 (use smartphrase ".now" to insert current time)  Test: Troponin Critical Value: 0.15  Name of Provider Notified: Dr. [name]  Orders Received? Or Actions Taken?: Notified

## 2017-06-09 NOTE — Care Management (Signed)
It is documented that patient is from [place] but his address is [employer] which looks more like [place]. Confirmed that patient presents from [facility] [place] [facility] - independent living

## 2017-06-09 NOTE — Progress Notes (Signed)
Pharmacy Antibiotic Note  Ruben Gallegos is a 84 y.o. male admitted on 06/09/2017 with pneumonia.  Pharmacy has been consulted for Cefepime dosing.  Patient received one dose of cefepime 2g and vancomycin 1000mg  & 1250mg  once in ED.   Plan: Will give Cefepime 2g Q12H due to CrCl of 46.80mL/min.  Vancomycin was discontinued per Dr. Margaretmary Eddy due to MRSA PCR negative.   Height: 6\' 1"  (185.4 cm) Weight: 174 lb 6.1 oz (79.1 kg) IBW/kg (Calculated) : 79.9  Temp (24hrs), Avg:98.1 F (36.7 C), Min:97.5 F (36.4 C), Max:99.2 F (37.3 C)  Recent Labs  Lab 06/09/17 1020 06/09/17 1057 06/09/17 1342  WBC 5.1  --   --   CREATININE 1.09  --   --   LATICACIDVEN  --  1.4 1.1    Estimated Creatinine Clearance: 46.4 mL/min (by C-G formula based on SCr of 1.09 mg/dL).    Allergies  Allergen Reactions  . Altace [Ramipril] Shortness Of Breath  . Tramadol Shortness Of Breath  . Actos [Pioglitazone] Swelling  . Codeine Nausea Only  . Demerol [Meperidine] Nausea Only  . Lipitor [Atorvastatin] Other (See Comments)  . Metformin And Related Other (See Comments)  . Mevacor [Lovastatin] Other (See Comments)    Antimicrobials this admission: Vancomycin x 2 doses Cefepime 3/8 >>   Dose adjustments this admission:   Microbiology results: BCx: 3/8 >> sent  UCx: 3/8 >> sent Sputum: 3/8 >> sent MRSA PCR: NEGATIVE  PCT < 0.1  Thank you for allowing pharmacy to be a part of this patient's care.  Lendon Ka, PharmD Pharmacy Resident 06/09/2017 7:57 PM

## 2017-06-09 NOTE — ED Provider Notes (Signed)
[facility] Emergency Department Provider Note ____________________________________________   I have reviewed the triage vital signs and the triage nursing note.  HISTORY  Chief Complaint Influenza (Flu like symptoms x 7 days)   Historian Patient As well as family members  HPI Ruben Gallegos is a 84 y.o. male brought in from [facility] home for increased coughing shortness of breath and trouble breathing and found to have low-grade temperature today.  Sound like 2 or 3 days of worsening shortness of breath, and the patient with a history of CHF.  He saw his cardiologist, Dr. Nehemiah Massed 2 days ago who placed him on increased dose of Lasix, he is first day of increased Lasix was yesterday.  Family saw him this morning and noticed worsening problems breathing, nonproductive cough, and found to have low-grade temperature.  Patient also noted to be hypoxic today, 87% room air, he does not typically have underlying lung problems or a history of low oxygen.     Past Medical History:  Diagnosis Date  . Cancer ([place])   . CHF (congestive heart failure) ([place])   . Diabetes mellitus without complication ([place])   . GERD (gastroesophageal reflux disease)   . HLD (hyperlipidemia)   . Hypertension   . Hypothyroidism     Patient Active Problem List   Diagnosis Date Noted  . Chest pain 04/13/2016  . HLD (hyperlipidemia) 10/15/2015  . Acne erythematosa 10/15/2015  . Diabetes ([place]) 08/25/2015  . HTN (hypertension) 08/25/2015  . Hypothyroidism 08/25/2015  . Acute on chronic systolic CHF (congestive heart failure) ([place]) 08/25/2015  . GERD (gastroesophageal reflux disease) 08/25/2015  . Arteriosclerosis of coronary artery 08/10/2015  . Beat, premature ventricular 08/10/2015  . Paroxysmal supraventricular tachycardia ([place]) 05/21/2015  . H/O deep venous thrombosis 03/23/2015  . Healed or old pulmonary embolism 03/23/2015  . Edema leg 03/23/2015    Past Surgical  History:  Procedure Laterality Date  . APPENDECTOMY    . CATARACT EXTRACTION    . HERNIA REPAIR      Prior to Admission medications   Medication Sig Start Date End Date Taking? Authorizing Provider  carvedilol (COREG) 3.125 MG tablet Take 3.125 mg by mouth 2 (two) times daily.   Yes [provider]  Cholecalciferol (VITAMIN D-3) 1000 units CAPS Take 1,000 Units by mouth daily.   Yes [provider]  esomeprazole (NEXIUM) 40 MG capsule Take 40 mg by mouth daily.   Yes [provider]  furosemide (LASIX) 40 MG tablet Take 1 tablet (40 mg total) by mouth daily. Patient taking differently: Take 80 mg by mouth daily.  08/27/15  Yes Demetrios Loll, MD  glipiZIDE (GLUCOTROL) 10 MG tablet Take 10 mg by mouth daily before breakfast.    Yes [provider]  insulin glargine (LANTUS) 100 UNIT/ML injection Inject 12-26 Units into the skin at bedtime. 12UNITS-AM/26UNITS-PM   Yes [provider]  isosorbide mononitrate (IMDUR) 30 MG 24 hr tablet Take 1 tablet (30 mg total) by mouth daily. 04/15/16  Yes Epifanio Lesches, MD  levothyroxine (SYNTHROID, LEVOTHROID) 100 MCG tablet Take 100 mcg by mouth daily.   Yes [provider]  mirtazapine (REMERON) 7.5 MG tablet Take 7.5 mg by mouth daily.   Yes [provider]  potassium chloride (K-DUR) 10 MEQ tablet Take 10 mEq by mouth daily.   Yes [provider]  spironolactone (ALDACTONE) 25 MG tablet Take 12.5 mg by mouth daily.   Yes [provider]  XARELTO 20 MG TABS tablet  Take 20 mg by mouth daily.   Yes [provider]  acetaminophen (TYLENOL) 325 MG tablet Take 325 mg by mouth 3 (three) times daily.    [provider]  mupirocin ointment (BACTROBAN) 2 % Place 1 application into the nose 2 (two) times daily. 04/14/16   Epifanio Lesches, MD  nitroGLYCERIN (NITROSTAT) 0.4 MG SL tablet Place 0.4 mg under the tongue every 5 (five) minutes as needed for chest pain.     [provider]    Allergies  Allergen Reactions  . Altace [Ramipril] Shortness Of Breath  . Tramadol Shortness Of Breath  . Actos [Pioglitazone] Swelling  . Codeine Nausea Only  . Demerol [Meperidine] Nausea Only  . Lipitor [Atorvastatin] Other (See Comments)  . Metformin And Related Other (See Comments)  . Mevacor [Lovastatin] Other (See Comments)    Family History  Problem Relation Age of Onset  . CAD Unknown   . Diabetes Unknown   . Diabetes Mother   . CVA Father     Social History Social History   Tobacco Use  . Smoking status: Former Research scientist (life sciences)  . Smokeless tobacco: Never Used  Substance Use Topics  . Alcohol use: Yes    Alcohol/week: 0.0 oz  . Drug use: No    Review of Systems  Constitutional: Positive for fever. Eyes: Negative for visual changes. ENT: Negative for sore throat. Cardiovascular: Negative for chest pain. Respiratory: Negative for shortness of breath. Gastrointestinal: Negative for abdominal pain, vomiting and diarrhea. Genitourinary: Negative for dysuria. Musculoskeletal: Negative for back pain. Skin: Negative for rash. Neurological: Negative for headache.  ____________________________________________   PHYSICAL EXAM:  VITAL SIGNS: ED Triage Vitals  Enc Vitals Group     BP 06/09/17 1005 104/68     Pulse Rate 06/09/17 1005 (!) 103     Resp --      Temp 06/09/17 1005 99.2 F (37.3 C)     Temp Source 06/09/17 1005 Oral     SpO2 06/09/17 1005 91 %     Weight 06/09/17 1006 180 lb (81.6 kg)     Height 06/09/17 1006 6\' 1"  (1.854 m)     Head Circumference --      Peak Flow --      Pain Score --      Pain Loc --      Pain Edu? --      Excl. in [place]? --      Constitutional: Alert and oriented. Well appearing and in no distress. HEENT   Head: Normocephalic and atraumatic.      Eyes: Conjunctivae are normal. Pupils equal and round.       Ears:         Nose: No congestion/rhinnorhea.   Mouth/Throat: Mucous membranes  are mildly dry.   Neck: No stridor. Cardiovascular/Chest: Normal rate, regular rhythm.  No murmurs, rubs, or gallops. Respiratory: Normal respiratory effort without tachypnea nor retractions.  Decreased breath sounds in all fields, mild rhonchi, no obvious wheezing or rales. Gastrointestinal: Soft. No distention, no guarding, no rebound. Nontender.    Genitourinary/rectal:Deferred Musculoskeletal: Nontender with normal range of motion in all extremities. No joint effusions.  No lower extremity tenderness.  Left lower extremity 2+ pitting edema.  Right lower 71+ pitting edema. Neurologic:  Normal speech and language. No gross or focal neurologic deficits are appreciated. Skin:  Skin is warm, dry and intact. No rash noted. Psychiatric: Mood and affect are normal. Speech and behavior are normal. Patient exhibits appropriate insight and judgment.  ____________________________________________  LABS (pertinent positives/negatives) I, Lisa Roca, MD the attending physician have reviewed the labs noted below.  Labs Reviewed  COMPREHENSIVE METABOLIC PANEL - Abnormal; Notable for the following components:      Result Value   Glucose, Bld 186 (*)    Calcium 8.4 (*)    Albumin 3.4 (*)    GFR calc non Af Amer 56 (*)    All other components within normal limits  TROPONIN I - Abnormal; Notable for the following components:   Troponin I 0.15 (*)    All other components within normal limits  CBC WITH DIFFERENTIAL/PLATELET - Abnormal; Notable for the following components:   Lymphs Abs 0.6 (*)    All other components within normal limits  BLOOD GAS, VENOUS - Abnormal; Notable for the following components:   pCO2, Ven 38 (*)    pO2, Ven 55.0 (*)    All other components within normal limits  CULTURE, BLOOD (ROUTINE X 2)  CULTURE, BLOOD (ROUTINE X 2)  URINE CULTURE  INFLUENZA PANEL BY PCR (TYPE A & B)  URINALYSIS, COMPLETE (UACMP) WITH MICROSCOPIC  LACTIC ACID, PLASMA  LACTIC ACID, PLASMA     ____________________________________________    EKG I, Lisa Roca, MD, the attending physician have personally viewed and interpreted all ECGs.  100 bpm.  Sinus tachycardia.  Left bundle branch block.  Nonspecific ST and T wave ____________________________________________  RADIOLOGY   Chest x-ray portable reviewed by me and interpreted by me: Concerning for right lower lobe pneumonia  Radiologist interpretation:IMPRESSION: 1. New ill-defined opacities at the right lung base, highly suspicious for pneumonia. 2. Bilateral interstitial prominence, presumably interstitial edema, similar to previous exams suggesting mild chronic versus recurrent CHF/volume overload. 3. Aortic atherosclerosis.  __________________________________________  PROCEDURES  Procedure(s) performed: None  Critical Care performed: CRITICAL CARE Performed by: Lisa Roca   Total critical care time: 30 minutes  Critical care time was exclusive of separately billable procedures and treating other patients.  Critical care was necessary to treat or prevent imminent or life-threatening deterioration.  Critical care was time spent personally by me on the following activities: development of treatment plan with patient and/or surrogate as well as nursing, discussions with consultants, evaluation of patient's response to treatment, examination of patient, obtaining history from patient or surrogate, ordering and performing treatments and interventions, ordering and review of laboratory studies, ordering and review of radiographic studies, pulse oximetry and re-evaluation of patient's condition.    ____________________________________________  ED COURSE / ASSESSMENT AND PLAN  Pertinent labs & imaging results that were available during my care of the patient were reviewed by me and considered in my medical decision making (see chart for details).   Concern for sepsis upon arrival with low-grade  temperature of 99, noted to be higher per family at the nursing home, also with tachycardia upon arrival, hypoxia upon arrival and new shortness of breath and cough concerning for likely pneumonia.  Portable chest x-ray does show a new right lower lobe infiltrate, patient will be cover for healthcare acquired pneumonia.  Initial blood pressure no hypotension, repeat blood pressure was slightly softer low in the 90s.  I am a little concerned about significant fluid resuscitation given his underlying CHF, will start with 500 cc.  Lactate not elevated.  No elevated white blood cell count.  Flu negative.  In any case, I do think patient needs observation admission overnight over the next 24 hours for monitoring stability and treatment of healthcare acquired pneumonia and possible sepsis.  Prior  level slightly elevated, although it looks like it has been previously also slightly elevated.  Patient was given aspirin.  I am doubtful of ACS.  Patient is not having any chest pain.  DIFFERENTIAL DIAGNOSIS: Differential includes, but is not limited to, viral syndrome, bronchitis including COPD exacerbation, pneumonia, reactive airway disease including asthma, CHF including exacerbation with or without pulmonary/interstitial edema, pneumothorax, ACS, thoracic trauma, and pulmonary embolism.  CONSULTATIONS:  Hospitalist for admission.   Patient / Family / Caregiver informed of clinical course, medical decision-making process, and agree with plan.    ___________________________________________   FINAL CLINICAL IMPRESSION(S) / ED DIAGNOSES   Final diagnoses:  Pneumonia of right lung due to infectious organism, unspecified part of lung  Sepsis, due to unspecified organism ([place])  Hypoxia  Troponin I above reference range      ___________________________________________        Note: This dictation was prepared with Dragon dictation. Any transcriptional errors that result from this process  are unintentional    Lisa Roca, MD 06/09/17 1156

## 2017-06-09 NOTE — Progress Notes (Signed)
CCMD called to report 3 beats of Vtach, MD notified,  no new orders. Patient lying comfortably in bed with no complaints. CCMD called minutes later reporting false reading of vtach. Will continue to monitor patient.

## 2017-06-09 NOTE — ED Notes (Signed)
EDP and Hospitals aware of BP. No new orders at this time.

## 2017-06-09 NOTE — ED Triage Notes (Signed)
EMS reports flu like symptoms x 7 days,  325 Tylenol given @ 0815 CBG= 129 118/76 T 98.8 95% on 2 lpm [place]  Patient A & O x 4

## 2017-06-09 NOTE — H&P (Signed)
[place] [facility] [place] NAME: Ruben Gallegos    MR#:  [id number]  DATE OF BIRTH:  September 28, 1920  DATE OF ADMISSION:  06/09/2017  PRIMARY CARE PHYSICIAN: [name], MD   REQUESTING/REFERRING PHYSICIAN: [name]  CHIEF COMPLAINT:   Sob, hypotensive HISTORY OF PRESENT ILLNESS:  Ruben Gallegos  is a 84 y.o. male with a known history of diabetes mellitus, hypertension, hypothyroidism, hyperlipidemia, chronic congestive heart failure is presenting to the ED with a chief complaint of shortness of breath, cough and congestion.  Denies any chest pain.  Patient was found to be hypoxic with 87% pulse ox on room air and 94% on 2 L, EKG with sinus tachycardia at 100 but lactic acid is normal.  Chest x-ray has revealed right lower lobe pneumonia.  Patient is started on cefepime and vancomycin and hospitalist team is called to admit the patient.  She was found to be hypotensive also but patient reports that his cardiologist Dr. Nehemiah Massed has placed him on increased dose of Lasix 2 days ago.  Patient was given fluid bolus in the ED and  patient started feeling much better.  Family members at bedside.  PAST MEDICAL HISTORY:   Past Medical History:  Diagnosis Date  . Cancer ([place])   . CHF (congestive heart failure) ([place])   . Diabetes mellitus without complication ([place])   . GERD (gastroesophageal reflux disease)   . HLD (hyperlipidemia)   . Hypertension   . Hypothyroidism     PAST SURGICAL HISTOIRY:   Past Surgical History:  Procedure Laterality Date  . APPENDECTOMY    . CATARACT EXTRACTION    . HERNIA REPAIR      SOCIAL HISTORY:   Social History   Tobacco Use  . Smoking status: Former Research scientist (life sciences)  . Smokeless tobacco: Never Used  Substance Use Topics  . Alcohol use: Yes    Alcohol/week: 0.0 oz    FAMILY HISTORY:   Family History  Problem Relation Age of Onset  . CAD Unknown   . Diabetes Unknown   . Diabetes Mother   . CVA Father     DRUG  ALLERGIES:   Allergies  Allergen Reactions  . Altace [Ramipril] Shortness Of Breath  . Tramadol Shortness Of Breath  . Actos [Pioglitazone] Swelling  . Codeine Nausea Only  . Demerol [Meperidine] Nausea Only  . Lipitor [Atorvastatin] Other (See Comments)  . Metformin And Related Other (See Comments)  . Mevacor [Lovastatin] Other (See Comments)    REVIEW OF SYSTEMS:  CONSTITUTIONAL: No fever, fatigue or weakness.  EYES: No blurred or double vision.  EARS, NOSE, AND THROAT: No tinnitus or ear pain.  RESPIRATORY: reports cough, shortness of breath, no wheezing or hemoptysis.  CARDIOVASCULAR: No chest pain, orthopnea, edema.  GASTROINTESTINAL: No nausea, vomiting, diarrhea or abdominal pain.  GENITOURINARY: No dysuria, hematuria.  ENDOCRINE: No polyuria, nocturia,  HEMATOLOGY: No anemia, easy bruising or bleeding SKIN: No rash or lesion. MUSCULOSKELETAL: No joint pain or arthritis.   NEUROLOGIC: No tingling, numbness, weakness.  PSYCHIATRY: No anxiety or depression.   MEDICATIONS AT HOME:   Prior to Admission medications   Medication Sig Start Date End Date Taking? Authorizing Provider  carvedilol (COREG) 3.125 MG tablet Take 3.125 mg by mouth 2 (two) times daily.   Yes [provider]  Cholecalciferol (VITAMIN D-3) 1000 units CAPS Take 1,000 Units by mouth daily.   Yes [provider]  esomeprazole (NEXIUM) 40 MG capsule Take 40 mg by  mouth daily.   Yes [provider]  furosemide (LASIX) 40 MG tablet Take 1 tablet (40 mg total) by mouth daily. Patient taking differently: Take 80 mg by mouth daily.  08/27/15  Yes Demetrios Loll, MD  glipiZIDE (GLUCOTROL) 10 MG tablet Take 10 mg by mouth daily before breakfast.    Yes [provider]  insulin glargine (LANTUS) 100 UNIT/ML injection Inject 12-26 Units into the skin at bedtime. 12UNITS-AM/26UNITS-PM   Yes [provider]  isosorbide mononitrate (IMDUR) 30 MG 24 hr tablet Take 1 tablet (30 mg  total) by mouth daily. 04/15/16  Yes Epifanio Lesches, MD  levothyroxine (SYNTHROID, LEVOTHROID) 100 MCG tablet Take 100 mcg by mouth daily.   Yes [provider]  mirtazapine (REMERON) 7.5 MG tablet Take 7.5 mg by mouth daily.   Yes [provider]  potassium chloride (K-DUR) 10 MEQ tablet Take 10 mEq by mouth daily.   Yes [provider]  spironolactone (ALDACTONE) 25 MG tablet Take 12.5 mg by mouth daily.   Yes [provider]  XARELTO 20 MG TABS tablet Take 20 mg by mouth daily.   Yes [provider]  acetaminophen (TYLENOL) 325 MG tablet Take 325 mg by mouth 3 (three) times daily.    [provider]  mupirocin ointment (BACTROBAN) 2 % Place 1 application into the nose 2 (two) times daily. 04/14/16   Epifanio Lesches, MD  nitroGLYCERIN (NITROSTAT) 0.4 MG SL tablet Place 0.4 mg under the tongue every 5 (five) minutes as needed for chest pain.    [provider]      VITAL SIGNS:  Blood pressure (!) 92/55, pulse 77, temperature 99.2 F (37.3 C), temperature source Oral, resp. rate (!) 24, height 6' 1"  (1.854 m), weight 81.6 kg (180 lb), SpO2 92 %.  PHYSICAL EXAMINATION:  GENERAL:  84 y.o.-year-old patient lying in the bed with no acute distress.  EYES: Pupils equal, round, reactive to light and accommodation. No scleral icterus. Extraocular muscles intact.  HEENT: Head atraumatic, normocephalic. Oropharynx and nasopharynx clear.  NECK:  Supple, no jugular venous distention. No thyroid enlargement, no tenderness.  LUNGS: Mod breath sounds bilaterally, no wheezing, rales,rhonchi , rt basal crepitation. No use of accessory muscles of respiration.  CARDIOVASCULAR: S1, S2 normal. No murmurs, rubs, or gallops.  ABDOMEN: Soft, nontender, nondistended. Bowel sounds present.  EXTREMITIES: No pedal edema, cyanosis, or clubbing.  NEUROLOGIC: Cranial nerves II through XII are intact. Muscle strength generalized weakness in all  extremities. Sensation intact. Gait not checked.  PSYCHIATRIC: The patient is alert and oriented x 3.  SKIN: No obvious rash, lesion, or ulcer.   LABORATORY PANEL:   CBC Recent Labs  Lab 06/09/17 1020  WBC 5.1  HGB 14.8  HCT 44.5  PLT 185   ------------------------------------------------------------------------------------------------------------------  Chemistries  Recent Labs  Lab 06/09/17 1020  NA 135  K 3.8  CL 102  CO2 23  GLUCOSE 186*  BUN 19  CREATININE 1.09  CALCIUM 8.4*  AST 29  ALT 18  ALKPHOS 60  BILITOT 1.1   ------------------------------------------------------------------------------------------------------------------  Cardiac Enzymes Recent Labs  Lab 06/09/17 1020  TROPONINI 0.15*   ------------------------------------------------------------------------------------------------------------------  RADIOLOGY:  Dg Chest Port 1 View  Result Date: 06/09/2017 CLINICAL DATA:  Pt having flu like symptoms for 7 days. Hx of cancer, CHF, diabetes, hypertension. Former smoker EXAM: PORTABLE CHEST 1 VIEW COMPARISON:  Chest x-rays dated 04/13/2016 and 08/24/2015 FINDINGS: Ill-defined opacities overlying the elevated right hemidiaphragm, highly suspicious for pneumonia. Diffuse bilateral interstitial  prominence, similar to previous exam, suspected mild chronic versus recurrent interstitial edema. Heart size and mediastinal contours are stable. Atherosclerotic changes again noted at the aortic arch. No pneumothorax or large pleural effusion seen. No acute or suspicious osseous finding. IMPRESSION: 1. New ill-defined opacities at the right lung base, highly suspicious for pneumonia. 2. Bilateral interstitial prominence, presumably interstitial edema, similar to previous exams suggesting mild chronic versus recurrent CHF/volume overload. 3. Aortic atherosclerosis. Electronically Signed   By: Franki Cabot M.D.   On: 06/09/2017 10:45    EKG:   Orders placed or  performed during the hospital encounter of 06/09/17  . EKG 12-Lead  . EKG 12-Lead    IMPRESSION AND PLAN:   Hyden Soley  is a 84 y.o. male with a known history of diabetes mellitus, hypertension, hypothyroidism, hyperlipidemia, chronic congestive heart failure is presenting to the ED with a chief complaint of shortness of breath, cough and congestion.  Denies any chest pain.  Patient was found to be hypoxic with 87% pulse ox on room air and 94% on 2 L, EKG with sinus tachycardia at 100 but lactic acid is normal.  Chest x-ray has revealed right lower lobe pneumonia.    #Sepsis with right lower lobe pneumonia Patient met septic criteria with hypotension, tachycardia and tachypnea at the time of admission Patient is started on cefepime and vancomycin in the emergency department will continue the same Hold off on the Lasix and other hypertensives Gentle hydration with IV fluids with close monitoring for symptoms and signs of fluid overload Lactic acid is in the normal range   #elevated troponin-probably demand ischemia  patient seems to  has chronically elevated troponin when compared to previous labs We will cycle cardiac biomarkers and monitor patient closely  #Chronic systolic CHF Continue close monitoring of daily weights and intake and output Holding Lasix in view of hypotension and sepsis #Hypothyroidism continue Synthyroid   #Chronic history of DVT-continue Xarelto his home medication  #Chronic COPD-currently no exacerbation continue bronchodilator treatment as needed  #Diabetes mellitus currently patient is hypoglycemic will hold off on the Lantus and Glucotrol and-provide sliding scale insulin   All the records are reviewed and case discussed with ED provider. Management plans discussed with the patient, family and they are in agreement.  CODE STATUS: DNR  TOTAL TIME TAKING CARE OF THIS PATIENT:  minutes.   Note: This dictation was prepared with Dragon dictation along  with smaller phrase technology. Any transcriptional errors that result from this process are unintentional.  [name] M.D on 06/09/2017 at 1:08 PM  Between 7am to 6pm - Pager - [phone]  After 6pm go to [email or web address] - password [email or web address] [facility]  [place] [facility]  Office  [phone]  CC: Primary care physician; [name], MD

## 2017-06-10 ENCOUNTER — Inpatient Hospital Stay: Payer: Medicare Other

## 2017-06-10 DIAGNOSIS — R0600 Dyspnea, unspecified: Secondary | ICD-10-CM

## 2017-06-10 LAB — GLUCOSE, CAPILLARY
GLUCOSE-CAPILLARY: 111 mg/dL — AB (ref 65–99)
GLUCOSE-CAPILLARY: 151 mg/dL — AB (ref 65–99)
GLUCOSE-CAPILLARY: 287 mg/dL — AB (ref 65–99)
Glucose-Capillary: 379 mg/dL — ABNORMAL HIGH (ref 65–99)

## 2017-06-10 LAB — BLOOD GAS, ARTERIAL
ACID-BASE DEFICIT: 1 mmol/L (ref 0.0–2.0)
BICARBONATE: 22.6 mmol/L (ref 20.0–28.0)
FIO2: 0.5
O2 Saturation: 92.4 %
PATIENT TEMPERATURE: 37
PO2 ART: 63 mmHg — AB (ref 83.0–108.0)
pCO2 arterial: 34 mmHg (ref 32.0–48.0)
pH, Arterial: 7.43 (ref 7.350–7.450)

## 2017-06-10 LAB — TROPONIN I
Troponin I: 0.11 ng/mL (ref <0.03)
Troponin I: 0.09 ng/mL (ref <0.03)

## 2017-06-10 LAB — STREP PNEUMONIAE URINARY ANTIGEN: Strep Pneumo Urinary Antigen: NEGATIVE

## 2017-06-10 LAB — HIV ANTIBODY (ROUTINE TESTING W REFLEX): HIV Screen 4th Generation wRfx: NONREACTIVE

## 2017-06-10 LAB — PROCALCITONIN

## 2017-06-10 MED ORDER — IPRATROPIUM-ALBUTEROL 0.5-2.5 (3) MG/3ML IN SOLN
3.0000 mL | Freq: Four times a day (QID) | RESPIRATORY_TRACT | Status: DC
  Administered 2017-06-10 – 2017-06-11 (×5): 3 mL via RESPIRATORY_TRACT
  Filled 2017-06-10 (×4): qty 3

## 2017-06-10 MED ORDER — FUROSEMIDE 10 MG/ML IJ SOLN
20.0000 mg | Freq: Every day | INTRAMUSCULAR | Status: DC
  Administered 2017-06-10 – 2017-06-11 (×2): 20 mg via INTRAVENOUS
  Filled 2017-06-10 (×2): qty 2

## 2017-06-10 MED ORDER — SODIUM CHLORIDE 0.9% FLUSH
3.0000 mL | Freq: Two times a day (BID) | INTRAVENOUS | Status: DC
  Administered 2017-06-10 – 2017-06-15 (×9): 3 mL via INTRAVENOUS

## 2017-06-10 MED ORDER — METHYLPREDNISOLONE SODIUM SUCC 125 MG IJ SOLR
125.0000 mg | INTRAMUSCULAR | Status: AC
  Administered 2017-06-10: 125 mg via INTRAVENOUS
  Filled 2017-06-10: qty 2

## 2017-06-10 MED ORDER — ALBUTEROL SULFATE (2.5 MG/3ML) 0.083% IN NEBU
2.5000 mg | INHALATION_SOLUTION | RESPIRATORY_TRACT | Status: DC | PRN
Start: 2017-06-10 — End: 2017-06-11
  Administered 2017-06-10: 2.5 mg via RESPIRATORY_TRACT
  Filled 2017-06-10: qty 3

## 2017-06-10 NOTE — Progress Notes (Signed)
Notified Dr. Marcille Blanco of new ventricular bigeminy.  Will continue to monitor.

## 2017-06-10 NOTE — Progress Notes (Signed)
Upon assessing patient, patient short of breath at rest, saturations at 83% on 3L, patient increased to 5L and respiratory called to assess. Patient placed on venturi mask and MD notified. Orders given for IV solumedrol x1, q6 duonebs and albuterol q4 hours PRN. MD came to assess patient and chest xray ordered as well as ABG, will continue to monitor.

## 2017-06-10 NOTE — Consult Note (Signed)
[facility] Consultation   ASSESSMENT/PLAN   Congestive heart failure. Agree with diuretic therapy, echocardiography, sodium and fluid restriction, daily weights, may require cardiology input. Patient also has component of bronchospasm. Agree with albuterol, Atrovent, short course of steroids, wean FiO2 to keep oxygen saturation greater than 90%  Pneumonia. Right lower lobe infiltrate, has been started on vancomycin and cefepime empirically  Diabetes. On coverage     Name: Ruben Gallegos MRN: [id number] DOB: 08-31-1920    ADMISSION DATE:  06/09/2017 CONSULTATION DATE:  06/10/2017  REFERRING MD :  Dr. Margaretmary Eddy  CHIEF COMPLAINT:  Shortness of breath   HISTORY OF PRESENT ILLNESS:  Ruben Gallegos is a very pleasant 84 year old gentleman with a past medical history consistent with ischemic cardiomyopathy with a historical ejection fraction of 15%, hypertension, hyperlipidemia, hypothyroidism, diabetes mellitus, presented with increasing shortness of breath, cough, congestion, discolored sputum and was admitted for exacerbation of heart failure with possible superimposed pneumonia. Patient states he is feeling better today, presently getting a breathing treatment  PAST MEDICAL HISTORY :  Past Medical History:  Diagnosis Date  . Cancer ([place])   . CHF (congestive heart failure) ([place])   . Diabetes mellitus without complication ([place])   . GERD (gastroesophageal reflux disease)   . HLD (hyperlipidemia)   . Hypertension   . Hypothyroidism    Past Surgical History:  Procedure Laterality Date  . APPENDECTOMY    . CATARACT EXTRACTION    . HERNIA REPAIR     Prior to Admission medications   Medication Sig Start Date End Date Taking? Authorizing Provider  carvedilol (COREG) 3.125 MG tablet Take 3.125 mg by mouth 2 (two) times daily.   Yes [provider]  Cholecalciferol (VITAMIN D-3) 1000 units CAPS Take 1,000 Units by mouth daily.   Yes [provider]    esomeprazole (NEXIUM) 40 MG capsule Take 40 mg by mouth daily.   Yes [provider]  furosemide (LASIX) 40 MG tablet Take 1 tablet (40 mg total) by mouth daily. Patient taking differently: Take 80 mg by mouth daily.  08/27/15  Yes Demetrios Loll, MD  glipiZIDE (GLUCOTROL) 10 MG tablet Take 10 mg by mouth daily before breakfast.    Yes [provider]  insulin glargine (LANTUS) 100 UNIT/ML injection Inject 12-26 Units into the skin at bedtime. 12UNITS-AM/26UNITS-PM   Yes [provider]  isosorbide mononitrate (IMDUR) 30 MG 24 hr tablet Take 1 tablet (30 mg total) by mouth daily. 04/15/16  Yes Epifanio Lesches, MD  levothyroxine (SYNTHROID, LEVOTHROID) 100 MCG tablet Take 100 mcg by mouth daily.   Yes [provider]  mirtazapine (REMERON) 7.5 MG tablet Take 7.5 mg by mouth daily.   Yes [provider]  potassium chloride (K-DUR) 10 MEQ tablet Take 10 mEq by mouth daily.   Yes [provider]  spironolactone (ALDACTONE) 25 MG tablet Take 12.5 mg by mouth daily.   Yes [provider]  XARELTO 20 MG TABS tablet Take 20 mg by mouth daily.   Yes [provider]  acetaminophen (TYLENOL) 325 MG tablet Take 325 mg by mouth 3 (three) times daily.    [provider]  mupirocin ointment (BACTROBAN) 2 % Place 1 application into the nose 2 (two) times daily. 04/14/16   Epifanio Lesches, MD  nitroGLYCERIN (NITROSTAT) 0.4 MG SL tablet Place 0.4 mg under the tongue every 5 (five) minutes as needed for chest pain.    [provider]   Allergies  Allergen Reactions  .  Altace [Ramipril] Shortness Of Breath  . Tramadol Shortness Of Breath  . Actos [Pioglitazone] Swelling  . Codeine Nausea Only  . Demerol [Meperidine] Nausea Only  . Lipitor [Atorvastatin] Other (See Comments)  . Metformin And Related Other (See Comments)  . Mevacor [Lovastatin] Other (See Comments)    FAMILY HISTORY:  Family History  Problem  Relation Age of Onset  . CAD Unknown   . Diabetes Unknown   . Diabetes Mother   . CVA Father    SOCIAL HISTORY:  reports that he has quit smoking. he has never used smokeless tobacco. He reports that he drinks alcohol. He reports that he does not use drugs.  REVIEW OF SYSTEMS:      The remainder of systems were reviewed and were found to be negative other than what is documented in the HPI.    VITAL SIGNS: Temp:  [97.5 F (36.4 C)-98.2 F (36.8 C)] 98.2 F (36.8 C) (03/09 0435) Pulse Rate:  [73-87] 87 (03/09 0920) Resp:  [17-24] 24 (03/09 0920) BP: (93-112)/(60-74) 112/62 (03/09 0920) SpO2:  [90 %-96 %] 96 % (03/09 1344) FiO2 (%):  [50 %] 50 % (03/09 1344) Weight:  [77.3 kg (170 lb 6.4 oz)-79.1 kg (174 lb 6.1 oz)] 77.3 kg (170 lb 6.4 oz) (03/09 0435) HEMODYNAMICS:   VENTILATOR SETTINGS: FiO2 (%):  [50 %] 50 % INTAKE / OUTPUT:  Intake/Output Summary (Last 24 hours) at 06/10/2017 1427 Last data filed at 06/10/2017 1405 Gross per 24 hour  Intake 480 ml  Output 850 ml  Net -370 ml    Physical Examination:   VS: BP 112/62 (BP Location: Right Arm)   Pulse 87   Temp 98.2 F (36.8 C) (Oral)   Resp (!) 24   Ht 6\' 1"  (1.854 m)   Wt 77.3 kg (170 lb 6.4 oz)   SpO2 96%   BMI 22.48 kg/m   General Appearance: Elderly appearing male, in mild respiratory distress, presently getting breathing treatment on any mask Neuro:without focal findings, mental status, speech normal,. HEENT: Trachea midline, no oral lesions appreciated, trachea is midline, no stridor noted  Pulmonary: Inspiratory rales appreciated bilateral posterior right greater than left Cardiovascular regular rate and rhythm, systolic murmur left parasternal border    Abdomen: Benign, Soft, non-tender, No masses, hepatosplenomegaly, No lymphadenopathy Skin:   warm, no rashes, no ecchymosis  Extremities: normal, no cyanosis, clubbing, no edema, warm with normal capillary refill.    LABS: Reviewed   LABORATORY  PANEL:   CBC Recent Labs  Lab 06/09/17 1020  WBC 5.1  HGB 14.8  HCT 44.5  PLT 185    Chemistries  Recent Labs  Lab 06/09/17 1020  NA 135  K 3.8  CL 102  CO2 23  GLUCOSE 186*  BUN 19  CREATININE 1.09  CALCIUM 8.4*  AST 29  ALT 18  ALKPHOS 60  BILITOT 1.1    Recent Labs  Lab 06/09/17 1737 06/09/17 2129 06/10/17 0751 06/10/17 1140  GLUCAP 141* 119* 111* 151*   Recent Labs  Lab 06/10/17 1011  PHART 7.43  PCO2ART 34  PO2ART 63*   Recent Labs  Lab 06/09/17 1020  AST 29  ALT 18  ALKPHOS 60  BILITOT 1.1  ALBUMIN 3.4*    Cardiac Enzymes Recent Labs  Lab 06/09/17 1020  TROPONINI 0.15*    RADIOLOGY:  Dg Chest 1 View  Result Date: 06/10/2017 CLINICAL DATA:  Pneumonia, shortness of breath, cough EXAM: CHEST 1 VIEW COMPARISON:  06/09/2017 FINDINGS: Elevation of the  right hemidiaphragm. Cardiomegaly. Diffuse interstitial prominence throughout the lungs appears worsened and may reflect interstitial edema. Bilateral lower lobe airspace opacities, slightly increased on the left since prior study, stable on the right. IMPRESSION: Cardiomegaly with diffuse interstitial prominence, likely interstitial edema. Stable elevation of the right hemidiaphragm. Bibasilar opacities, worsening on the left since prior study concerning for pneumonia. Electronically Signed   By: Rolm Baptise M.D.   On: 06/10/2017 10:24   Dg Chest Port 1 View  Result Date: 06/09/2017 CLINICAL DATA:  Pt having flu like symptoms for 7 days. Hx of cancer, CHF, diabetes, hypertension. Former smoker EXAM: PORTABLE CHEST 1 VIEW COMPARISON:  Chest x-rays dated 04/13/2016 and 08/24/2015 FINDINGS: Ill-defined opacities overlying the elevated right hemidiaphragm, highly suspicious for pneumonia. Diffuse bilateral interstitial prominence, similar to previous exam, suspected mild chronic versus recurrent interstitial edema. Heart size and mediastinal contours are stable. Atherosclerotic changes again noted at the  aortic arch. No pneumothorax or large pleural effusion seen. No acute or suspicious osseous finding. IMPRESSION: 1. New ill-defined opacities at the right lung base, highly suspicious for pneumonia. 2. Bilateral interstitial prominence, presumably interstitial edema, similar to previous exams suggesting mild chronic versus recurrent CHF/volume overload. 3. Aortic atherosclerosis. Electronically Signed   By: Franki Cabot M.D.   On: 06/09/2017 10:45     Hermelinda Dellen, DO  06/10/2017, 2:27 PM

## 2017-06-10 NOTE — Progress Notes (Signed)
Patient currently on venturi mask, still dyspneic at rest, o2 sats 95%, family updated with situation, MD called and updated on CXR results. Will continue to assess and monitor.

## 2017-06-10 NOTE — Progress Notes (Signed)
[facility] [place] NAME: Ruben Gallegos    MR#:  [id number]  DATE OF BIRTH:  1920/10/30  SUBJECTIVE:  CHIEF COMPLAINT: Patient is very short of breath today morning and working hard to breathe  REVIEW OF SYSTEMS:  CONSTITUTIONAL: No fever, fatigue or weakness.  EYES: No blurred or double vision.  EARS, NOSE, AND THROAT: No tinnitus or ear pain.  RESPIRATORY: Reporting worsening of cough, shortness of breath, wheezing, denies hemoptysis.  CARDIOVASCULAR: No chest pain, orthopnea, edema.  GASTROINTESTINAL: No nausea, vomiting, diarrhea or abdominal pain.  GENITOURINARY: No dysuria, hematuria.  ENDOCRINE: No polyuria, nocturia,  HEMATOLOGY: No anemia, easy bruising or bleeding SKIN: No rash or lesion. MUSCULOSKELETAL: No joint pain or arthritis.   NEUROLOGIC: No tingling, numbness, weakness.  PSYCHIATRY: No anxiety or depression.   DRUG ALLERGIES:   Allergies  Allergen Reactions  . Altace [Ramipril] Shortness Of Breath  . Tramadol Shortness Of Breath  . Actos [Pioglitazone] Swelling  . Codeine Nausea Only  . Demerol [Meperidine] Nausea Only  . Lipitor [Atorvastatin] Other (See Comments)  . Metformin And Related Other (See Comments)  . Mevacor [Lovastatin] Other (See Comments)    VITALS:  Blood pressure 112/62, pulse 87, temperature 98.2 F (36.8 C), temperature source Oral, resp. rate (!) 24, height _0  (1.854 m), weight 77.3 kg (170 lb 6.4 oz), SpO2 96 %.  PHYSICAL EXAMINATION:  GENERAL:  84 y.o.-year-old patient lying in the bed with no acute distress.  EYES: Pupils equal, round, reactive to light and accommodation. No scleral icterus. Extraocular muscles intact.  HEENT: Head atraumatic, normocephalic. Oropharynx and nasopharynx clear.  NECK:  Supple, no jugular venous distention. No thyroid enlargement, no tenderness.  LUNGS: Diminished breath sounds bilaterally, he has bilateral wheezing, rales,rhonchi or  crepitation.  Some  use of accessory muscles of respiration.  CARDIOVASCULAR: S1, S2 normal. No murmurs, rubs, or gallops.  ABDOMEN: Soft, nontender, nondistended. Bowel sounds present. No organomegaly or mass.  EXTREMITIES: No pedal edema, cyanosis, or clubbing.  NEUROLOGIC: Cranial nerves are intact. Muscle strength generalized weakness in all extremities. Sensation intact. Gait not checked.  PSYCHIATRIC: The patient is alert and oriented x 3.  SKIN: No obvious rash, lesion, or ulcer.    LABORATORY PANEL:   CBC Recent Labs  Lab 06/09/17 1020  WBC 5.1  HGB 14.8  HCT 44.5  PLT 185   ------------------------------------------------------------------------------------------------------------------  Chemistries  Recent Labs  Lab 06/09/17 1020  NA 135  K 3.8  CL 102  CO2 23  GLUCOSE 186*  BUN 19  CREATININE 1.09  CALCIUM 8.4*  AST 29  ALT 18  ALKPHOS 60  BILITOT 1.1   ------------------------------------------------------------------------------------------------------------------  Cardiac Enzymes Recent Labs  Lab 06/09/17 1020  TROPONINI 0.15*   ------------------------------------------------------------------------------------------------------------------  RADIOLOGY:  Dg Chest 1 View  Result Date: 06/10/2017 CLINICAL DATA:  Pneumonia, shortness of breath, cough EXAM: CHEST 1 VIEW COMPARISON:  06/09/2017 FINDINGS: Elevation of the right hemidiaphragm. Cardiomegaly. Diffuse interstitial prominence throughout the lungs appears worsened and may reflect interstitial edema. Bilateral lower lobe airspace opacities, slightly increased on the left since prior study, stable on the right. IMPRESSION: Cardiomegaly with diffuse interstitial prominence, likely interstitial edema. Stable elevation of the right hemidiaphragm. Bibasilar opacities, worsening on the left since prior study concerning for pneumonia. Electronically Signed   By: Rolm Baptise M.D.   On: 06/10/2017 10:24    Dg Chest Port 1 View  Result Date: 06/09/2017 CLINICAL DATA:  Pt having flu like  symptoms for 7 days. Hx of cancer, CHF, diabetes, hypertension. Former smoker EXAM: PORTABLE CHEST 1 VIEW COMPARISON:  Chest x-rays dated 04/13/2016 and 08/24/2015 FINDINGS: Ill-defined opacities overlying the elevated right hemidiaphragm, highly suspicious for pneumonia. Diffuse bilateral interstitial prominence, similar to previous exam, suspected mild chronic versus recurrent interstitial edema. Heart size and mediastinal contours are stable. Atherosclerotic changes again noted at the aortic arch. No pneumothorax or large pleural effusion seen. No acute or suspicious osseous finding. IMPRESSION: 1. New ill-defined opacities at the right lung base, highly suspicious for pneumonia. 2. Bilateral interstitial prominence, presumably interstitial edema, similar to previous exams suggesting mild chronic versus recurrent CHF/volume overload. 3. Aortic atherosclerosis. Electronically Signed   By: Franki Cabot M.D.   On: 06/09/2017 10:45    EKG:   Orders placed or performed during the hospital encounter of 06/09/17  . EKG 12-Lead  . EKG 12-Lead  . EKG    ASSESSMENT AND PLAN:   #Acute hypoxic respiratory failure secondary to pneumonia with bronchoconstriction Patient was placed on Ventimask Continue IV antibiotics and Solu-Medrol for bronchoconstriction Bronchodilator treatment IV Lasix for fluid overload  #Sepsis with right lower lobe pneumonia Patient met septic criteria with hypotension, tachycardia and tachypnea at the time of admission Patient is started on cefepime and vancomycin in the emergency department will continue the same Status post hydration with IV fluids Lactic acid is in the normal range   #elevated troponin-probably demand ischemia  patient seems to  has chronically elevated troponin when compared to previous labs We will cycle cardiac biomarkers and monitor patient closely Initial troponin   0.15 but patient is denying any chest pain  #Chronic systolic CHF previous ejection fraction was 15% Continue close monitoring of daily weights and intake and output Lasix 20 IV once daily Repeat echocardiogram  #Hypothyroidism continue Synthyroid   #Chronic history of DVT-continue Xarelto his home medication  #Chronic COPD-currently no exacerbation continue bronchodilator treatment as needed  #Diabetes mellitus currently patient is hypoglycemic will hold off on the Lantus and Glucotrol and-provide sliding scale insulin      All the records are reviewed and case discussed with Care Management/Social Workerr. Management plans discussed with the patient, family, son Clare Gandy and granddaughter and they are in agreement.  CODE STATUS: DNR   TOTAL CRITICALCARE TIME TAKING CARE OF THIS PATIENT: 41  minutes.   POSSIBLE D/C IN 2  DAYS, DEPENDING ON CLINICAL CONDITION.  Note: This dictation was prepared with Dragon dictation along with smaller phrase technology. Any transcriptional errors that result from this process are unintentional.   [name] M.D on 06/10/2017 at 3:01 PM  Between 7am to 6pm - Pager - [phone] After 6pm go to [email or web address] - password [email or web address] [facility]  [facility]  Office  [phone]  CC: Primary care physician; [name], MD

## 2017-06-10 NOTE — Progress Notes (Signed)
Inpatient Diabetes Program Recommendations  AACE/ADA: New Consensus Statement on Inpatient Glycemic Control (2015)  Target Ranges:  Prepandial:   less than 140 mg/dL      Peak postprandial:   less than 180 mg/dL (1-2 hours)      Critically ill patients:  140 - 180 mg/dL   Results for Ruben Gallegos, Ruben Gallegos (MRN [id number]) as of 06/10/2017 07:57  Ref. Range 06/09/2017 17:37 06/09/2017 21:29 06/10/2017 07:51  Glucose-Capillary Latest Ref Range: 65 - 99 mg/dL 141 (H)  Novolog 1 unit 119 (H) 111 (H)   Review of Glycemic Control  Home DM Meds: Lantus 12 units QAM, Lantus 26 units QPM, Glipizide 10 mg daily Current Orders: Novolog Sensitive Correction Scale/ SSI (0-9 units) TID AC + HS   Inpatient Diabetes Program Recommendations:  Agree with current orders. Fasting glucose 111 mg/dl today.  If glucose becomes consistently elevated greater than 180 mg/dl, may want to consider ordering low dose basal insulin starting with 50% of outpatient Lantus dose.  Thanks, Barnie Alderman, RN, MSN, CDE Diabetes Coordinator Inpatient Diabetes Program [phone] (Team Pager from 8am to 5pm)

## 2017-06-11 ENCOUNTER — Inpatient Hospital Stay: Payer: Medicare Other

## 2017-06-11 ENCOUNTER — Inpatient Hospital Stay
Admit: 2017-06-11 | Discharge: 2017-06-11 | Disposition: A | Discharge status: Home / Self Care | Payer: Medicare Other | Attending: Internal Medicine | Admitting: Internal Medicine

## 2017-06-11 LAB — BASIC METABOLIC PANEL
Anion gap: 10 (ref 5–15)
BUN: 22 mg/dL — ABNORMAL HIGH (ref 6–20)
CALCIUM: 8.3 mg/dL — AB (ref 8.9–10.3)
CHLORIDE: 105 mmol/L (ref 101–111)
CO2: 22 mmol/L (ref 22–32)
CREATININE: 0.94 mg/dL (ref 0.61–1.24)
Glucose, Bld: 350 mg/dL — ABNORMAL HIGH (ref 65–99)
Potassium: 3.6 mmol/L (ref 3.5–5.1)
SODIUM: 137 mmol/L (ref 135–145)

## 2017-06-11 LAB — URINE CULTURE: Culture: NO GROWTH

## 2017-06-11 LAB — GLUCOSE, CAPILLARY
GLUCOSE-CAPILLARY: 299 mg/dL — AB (ref 65–99)
GLUCOSE-CAPILLARY: 323 mg/dL — AB (ref 65–99)
GLUCOSE-CAPILLARY: 237 mg/dL — AB (ref 65–99)
Glucose-Capillary: 348 mg/dL — ABNORMAL HIGH (ref 65–99)

## 2017-06-11 LAB — TROPONIN I: TROPONIN I: 0.1 ng/mL — AB (ref <0.03)

## 2017-06-11 LAB — PROCALCITONIN

## 2017-06-11 LAB — ECHOCARDIOGRAM COMPLETE
HEIGHTINCHES: 73 in
Weight: 2726.4 oz

## 2017-06-11 LAB — HEMOGLOBIN A1C
HEMOGLOBIN A1C: 7.8 % — AB (ref 4.8–5.6)
Mean Plasma Glucose: 177 mg/dL

## 2017-06-11 MED ORDER — INSULIN GLARGINE 100 UNIT/ML ~~LOC~~ SOLN
10.0000 [IU] | Freq: Every day | SUBCUTANEOUS | Status: DC
  Administered 2017-06-11: 10 [IU] via SUBCUTANEOUS
  Filled 2017-06-11 (×2): qty 0.1

## 2017-06-11 MED ORDER — METOPROLOL TARTRATE 25 MG PO TABS
12.5000 mg | ORAL_TABLET | Freq: Two times a day (BID) | ORAL | Status: DC
  Administered 2017-06-11: 12.5 mg via ORAL
  Filled 2017-06-11 (×2): qty 1

## 2017-06-11 MED ORDER — ALPRAZOLAM 0.5 MG PO TABS
0.5000 mg | ORAL_TABLET | Freq: Two times a day (BID) | ORAL | Status: DC | PRN
  Administered 2017-06-12: 0.5 mg via ORAL
  Filled 2017-06-11: qty 1

## 2017-06-11 MED ORDER — INSULIN ASPART 100 UNIT/ML ~~LOC~~ SOLN
0.0000 [IU] | Freq: Three times a day (TID) | SUBCUTANEOUS | Status: DC
  Administered 2017-06-11 (×2): 11 [IU] via SUBCUTANEOUS
  Administered 2017-06-12: 5 [IU] via SUBCUTANEOUS
  Administered 2017-06-12 (×2): 11 [IU] via SUBCUTANEOUS
  Administered 2017-06-13: 3 [IU] via SUBCUTANEOUS
  Administered 2017-06-13: 11 [IU] via SUBCUTANEOUS
  Administered 2017-06-13: 8 [IU] via SUBCUTANEOUS
  Administered 2017-06-14: 2 [IU] via SUBCUTANEOUS
  Administered 2017-06-15: 3 [IU] via SUBCUTANEOUS
  Administered 2017-06-15: 2 [IU] via SUBCUTANEOUS
  Filled 2017-06-11 (×11): qty 1

## 2017-06-11 MED ORDER — METHYLPREDNISOLONE SODIUM SUCC 40 MG IJ SOLR
40.0000 mg | Freq: Three times a day (TID) | INTRAMUSCULAR | Status: DC
  Administered 2017-06-11 – 2017-06-13 (×5): 40 mg via INTRAVENOUS
  Filled 2017-06-11 (×5): qty 1

## 2017-06-11 MED ORDER — METHYLPREDNISOLONE SODIUM SUCC 40 MG IJ SOLR: 40.0000 mg | Freq: Two times a day (BID) | INTRAMUSCULAR | Status: DC

## 2017-06-11 MED ORDER — GUAIFENESIN-DM 100-10 MG/5ML PO SYRP: 10.0000 mL | ORAL_SOLUTION | Freq: Four times a day (QID) | ORAL | Status: DC | PRN

## 2017-06-11 MED ORDER — METHYLPREDNISOLONE SODIUM SUCC 40 MG IJ SOLR
40.0000 mg | INTRAMUSCULAR | Status: DC
  Administered 2017-06-11: 40 mg via INTRAVENOUS
  Filled 2017-06-11: qty 1

## 2017-06-11 MED ORDER — LEVALBUTEROL HCL 0.63 MG/3ML IN NEBU
0.6300 mg | INHALATION_SOLUTION | Freq: Four times a day (QID) | RESPIRATORY_TRACT | Status: DC
  Administered 2017-06-11 – 2017-06-15 (×16): 0.63 mg via RESPIRATORY_TRACT
  Filled 2017-06-11 (×16): qty 3

## 2017-06-11 MED ORDER — FUROSEMIDE 20 MG PO TABS
20.0000 mg | ORAL_TABLET | Freq: Every day | ORAL | Status: DC
  Administered 2017-06-11: 20 mg via ORAL
  Filled 2017-06-11 (×2): qty 1

## 2017-06-11 MED ORDER — LEVALBUTEROL HCL 0.63 MG/3ML IN NEBU
0.6300 mg | INHALATION_SOLUTION | RESPIRATORY_TRACT | Status: DC | PRN
  Administered 2017-06-11: 0.63 mg via RESPIRATORY_TRACT
  Filled 2017-06-11: qty 3

## 2017-06-11 MED ORDER — IPRATROPIUM BROMIDE 0.02 % IN SOLN
0.5000 mg | Freq: Four times a day (QID) | RESPIRATORY_TRACT | Status: DC
  Administered 2017-06-11 – 2017-06-15 (×16): 0.5 mg via RESPIRATORY_TRACT
  Filled 2017-06-11 (×16): qty 2.5

## 2017-06-11 MED ORDER — MORPHINE SULFATE (PF) 2 MG/ML IV SOLN: 1.0000 mg | INTRAVENOUS | Status: DC | PRN

## 2017-06-11 NOTE — Progress Notes (Signed)
Pt son talked to me if pt can have a morphine. Doctor Posey Pronto was notified and states he will place the order. Will continue to monitor.

## 2017-06-11 NOTE — Progress Notes (Signed)
Doctor Posey Pronto ordered to have a potable x-ray done for the pt to rule out aspiration. Will continue to monitor.

## 2017-06-11 NOTE — Progress Notes (Signed)
Inpatient Diabetes Program Recommendations  AACE/ADA: New Consensus Statement on Inpatient Glycemic Control (2015)  Target Ranges:  Prepandial:   less than 140 mg/dL      Peak postprandial:   less than 180 mg/dL (1-2 hours)      Critically ill patients:  140 - 180 mg/dL   Results for VALE, MOUSSEAU (MRN [id number]) as of 06/11/2017 08:04  Ref. Range 06/10/2017 07:51 06/10/2017 11:40 06/10/2017 16:47 06/10/2017 21:35 06/11/2017 07:41  Glucose-Capillary Latest Ref Range: 65 - 99 mg/dL 111 (H) 151 (H) 287 (H) 379 (H) 299 (H)   Review of Glycemic Control  HomeDM Meds: Lantus 12unitsQAM, Lantus 26 unitsQPM, Glipizide 10 mg daily CurrentOrders:Novolog Sensitive Correction Scale/ SSI (0-9 units) TID AC + HS   Inpatient Diabetes Program Recommendations:  Insulin-Basal: Please consider ordering Lantus 12 units QAM (start now; based on 77 kg x 0.15 units).  NOTE: Noted patient received Solumedrol 125 mg at 9:42 am on 06/10/17. Fasting glucose up to 299 mg/dl this morning.   Thanks, Barnie Alderman, RN, MSN, CDE Diabetes Coordinator Inpatient Diabetes Program [phone] (Team Pager from 8am to 5pm)

## 2017-06-11 NOTE — Progress Notes (Signed)
Pt has coughing episode and complain of shortness of breath, and also HR of 120's. Doctor Gouru was notified and ordered Xopenex nebulizer 0.63 mg every six hours and Ipratropium (atrovent) nebulizer 0.5 mg every six hours and to discontinue Duoneb. Doctor Gouru also ordered that if pt have a repeat episode of coughing spell and Shortness of Breath is to get a STAT EKG. Will continue to monitor.

## 2017-06-11 NOTE — Progress Notes (Signed)
RT called to room to assess patient, patient in distress and sob. Saturations good, placed patient on HFNC, RN aware and notified Dr. Margaretmary Eddy

## 2017-06-11 NOTE — Progress Notes (Addendum)
Pt has an episode continuous coughing and a HR of 129. Page doctor Gouru and ordered every 8 hours solumedrol IV 40 mg, xanax 0.5 mg tablet by mouth every 12 hours as needed for anxiety,  guaifenesin DM  10 ml by mouth every 6 hours, and cardiology consult. Pt was also ordered to be on to be place on high flow oxygen to help with shortness of breath and continuous coughing. Will continue to monitor.

## 2017-06-11 NOTE — Plan of Care (Signed)
  Progressing Clinical Measurements: Will remain free from infection 06/11/2017 1038 - Progressing by Liliane Channel, RN Respiratory complications will improve 06/11/2017 1038 - Progressing by Liliane Channel, RN Elimination: Will not experience complications related to bowel motility 06/11/2017 1038 - Progressing by Liliane Channel, RN Pain Managment: General experience of comfort will improve 06/11/2017 1038 - Progressing by Liliane Channel, RN Safety: Ability to remain free from injury will improve 06/11/2017 1038 - Progressing by Liliane Channel, RN

## 2017-06-11 NOTE — Progress Notes (Signed)
Doctor Gouru place an order for Metropolol 12.5 mg twice a day for HR control. Will continue to monitor.

## 2017-06-11 NOTE — Progress Notes (Signed)
Pt was watching tv and resting at this time. No complaints of Shortness of breath nor chest pain. Pt was informed about the change in his breathing treatment. Will continue to monitor.

## 2017-06-11 NOTE — Progress Notes (Signed)
Ruben Gallegos NAME: Ruben Gallegos    MR#:  [id number]  DATE OF BIRTH:  08/29/1920  SUBJECTIVE:  CHIEF COMPLAINT: Patient is feeling much better shortness of breath significantly improved.  Grandson at bedside  REVIEW OF SYSTEMS:  CONSTITUTIONAL: No fever, fatigue or weakness.  EYES: No blurred or double vision.  EARS, NOSE, AND THROAT: No tinnitus or ear pain.  RESPIRATORY: Reporting improving of cough, shortness of breath, wheezing, denies hemoptysis.  CARDIOVASCULAR: No chest pain, orthopnea, edema.  GASTROINTESTINAL: No nausea, vomiting, diarrhea or abdominal pain.  GENITOURINARY: No dysuria, hematuria.  ENDOCRINE: No polyuria, nocturia,  HEMATOLOGY: No anemia, easy bruising or bleeding SKIN: No rash or lesion. MUSCULOSKELETAL: No joint pain or arthritis.   NEUROLOGIC: No tingling, numbness, weakness.  PSYCHIATRY: No anxiety or depression.   DRUG ALLERGIES:   Allergies  Allergen Reactions  . Altace [Ramipril] Shortness Of Breath  . Tramadol Shortness Of Breath  . Actos [Pioglitazone] Swelling  . Codeine Nausea Only  . Demerol [Meperidine] Nausea Only  . Lipitor [Atorvastatin] Other (See Comments)  . Metformin And Related Other (See Comments)  . Mevacor [Lovastatin] Other (See Comments)    VITALS:  Blood pressure 104/63, pulse 83, temperature (!) 97.5 F (36.4 C), temperature source Axillary, resp. rate 18, height _0  (1.854 m), weight 77.3 kg (170 lb 6.4 oz), SpO2 97 %.  PHYSICAL EXAMINATION:  GENERAL:  84 y.o.-year-old patient lying in the bed with no acute distress.  EYES: Pupils equal, round, reactive to light and accommodation. No scleral icterus. Extraocular muscles intact.  HEENT: Head atraumatic, normocephalic. Oropharynx and nasopharynx clear.  NECK:  Supple, no jugular venous distention. No thyroid enlargement, no tenderness.  LUNGS: Moderate breath sounds bilaterally, no  wheezing, rales,rhonchi or  crepitation.  no   use of accessory muscles of respiration.  CARDIOVASCULAR: S1, S2 normal. No murmurs, rubs, or gallops.  ABDOMEN: Soft, nontender, nondistended. Bowel sounds present. No organomegaly or mass.  EXTREMITIES: No pedal edema, cyanosis, or clubbing.  NEUROLOGIC: Cranial nerves are intact. Muscle strength generalized weakness in all extremities. Sensation intact. Gait not checked.  PSYCHIATRIC: The patient is alert and oriented x 3.  SKIN: No obvious rash, lesion, or ulcer.    LABORATORY PANEL:   CBC Recent Labs  Lab 06/09/17 1020  WBC 5.1  HGB 14.8  HCT 44.5  PLT 185   ------------------------------------------------------------------------------------------------------------------  Chemistries  Recent Labs  Lab 06/09/17 1020 06/11/17 0336  NA 135 137  K 3.8 3.6  CL 102 105  CO2 23 22  GLUCOSE 186* 350*  BUN 19 22*  CREATININE 1.09 0.94  CALCIUM 8.4* 8.3*  AST 29  --   ALT 18  --   ALKPHOS 60  --   BILITOT 1.1  --    ------------------------------------------------------------------------------------------------------------------  Cardiac Enzymes Recent Labs  Lab 06/11/17 0336  TROPONINI 0.10*   ------------------------------------------------------------------------------------------------------------------  RADIOLOGY:  Dg Chest 1 View  Result Date: 06/10/2017 CLINICAL DATA:  Pneumonia, shortness of breath, cough EXAM: CHEST 1 VIEW COMPARISON:  06/09/2017 FINDINGS: Elevation of the right hemidiaphragm. Cardiomegaly. Diffuse interstitial prominence throughout the lungs appears worsened and may reflect interstitial edema. Bilateral lower lobe airspace opacities, slightly increased on the left since prior study, stable on the right. IMPRESSION: Cardiomegaly with diffuse interstitial prominence, likely interstitial edema. Stable elevation of the right hemidiaphragm. Bibasilar opacities, worsening on the left since prior study concerning for pneumonia.  Electronically Signed   By: Rolm Baptise M.D.  On: 06/10/2017 10:24    EKG:   Orders placed or performed during the hospital encounter of 06/09/17  . EKG 12-Lead  . EKG 12-Lead  . EKG    ASSESSMENT AND PLAN:   #Acute hypoxic respiratory failure secondary to pneumonia with bronchoconstriction Weaned off Ventimask to 5 L of oxygen via nasal cannula Continue IV antibiotics and Solu-Medrol for bronchoconstriction will be  taper as patient is clinically improving Bronchodilator treatment IV Lasix for fluid overload will be changed to p.o. Lasix spirometry  #Sepsis with right lower lobe pneumonia Patient met septic criteria with hypotension, tachycardia and tachypnea at the time of admission Patient is started on cefepime and vancomycin in the emergency department will continue the same Status post hydration with IV fluids Lactic acid is in the normal range   #elevated troponin-probably demand ischemia  patient seems to  has chronically elevated troponin when compared to previous labs We will cycle cardiac biomarkers and monitor patient closely Initial troponin  0.15 but patient is denying any chest pain  #Chronic systolic CHF previous ejection fraction was 15% Continue close monitoring of daily weights and intake and output Lasix 20 IV once daily Repeat echocardiogram  #Hypothyroidism continue Synthyroid   #Chronic history of DVT-continue Xarelto his home medication  #Acute COPD-on steroids and bronchodilator treatments Albuterol changed to Xopenex in view of tachycardia  #Diabetes mellitus currently patient is hypoglycemic will hold off on the Lantus and Glucotrol and-provide sliding scale insulin      All the records are reviewed and case discussed with Care Management/Social Workerr. Management plans discussed with the patient, family, grandson Ruben Gallegos and his wife at bedside and they are in agreement.  CODE STATUS: DNR   TOTAL CRITICALCARE TIME TAKING CARE  OF THIS PATIENT: 41  minutes.   POSSIBLE D/C IN1- 2  DAYS, DEPENDING ON CLINICAL CONDITION.  Note: This dictation was prepared with Dragon dictation along with smaller phrase technology. Any transcriptional errors that result from this process are unintentional.   [name] M.D on 06/11/2017 at 11:14 AM  Between 7am to 6pm - Pager - [phone] After 6pm go to [email or web address] - password [email or web address] [facility]  [facility]  Office  [phone]  CC: Primary care physician; [name], MD

## 2017-06-11 NOTE — Progress Notes (Addendum)
CCMD called that pt Has 23 run of SVT. Pt asymptomatic. Doctor Gouru was notified. Will continue to monitor.

## 2017-06-11 NOTE — Progress Notes (Signed)
For the episode of 23 run of SVT Doctor Gouru ordered Xopenex 0.63 mg nebulizer every 4 hours as needed for SOB and wheezing and discontinue albuterol. Will continue to monitor.

## 2017-06-12 ENCOUNTER — Encounter: Payer: Self-pay | Admitting: *Deleted

## 2017-06-12 ENCOUNTER — Other Ambulatory Visit: Payer: Self-pay

## 2017-06-12 LAB — CBC
HEMATOCRIT: 41.9 % (ref 40.0–52.0)
Hemoglobin: 14.2 g/dL (ref 13.0–18.0)
MCH: 32.1 pg (ref 26.0–34.0)
MCHC: 34 g/dL (ref 32.0–36.0)
MCV: 94.5 fL (ref 80.0–100.0)
PLATELETS: 169 10*3/uL (ref 150–440)
RBC: 4.43 MIL/uL (ref 4.40–5.90)
RDW: 13.8 % (ref 11.5–14.5)
WBC: 9.7 10*3/uL (ref 3.8–10.6)

## 2017-06-12 LAB — LEGIONELLA PNEUMOPHILA SEROGP 1 UR AG: L. pneumophila Serogp 1 Ur Ag: NEGATIVE

## 2017-06-12 LAB — GLUCOSE, CAPILLARY
GLUCOSE-CAPILLARY: 301 mg/dL — AB (ref 65–99)
Glucose-Capillary: 331 mg/dL — ABNORMAL HIGH (ref 65–99)
Glucose-Capillary: 225 mg/dL — ABNORMAL HIGH (ref 65–99)
Glucose-Capillary: 271 mg/dL — ABNORMAL HIGH (ref 65–99)

## 2017-06-12 LAB — TSH: TSH: 0.067 u[IU]/mL — ABNORMAL LOW (ref 0.350–4.500)

## 2017-06-12 MED ORDER — LORATADINE 10 MG PO TABS
10.0000 mg | ORAL_TABLET | Freq: Once | ORAL | Status: AC
  Administered 2017-06-12: 10 mg via ORAL
  Filled 2017-06-12: qty 1

## 2017-06-12 MED ORDER — AMIODARONE HCL IN DEXTROSE 360-4.14 MG/200ML-% IV SOLN
60.0000 mg/h | INTRAVENOUS | Status: AC
  Administered 2017-06-12: 60 mg/h via INTRAVENOUS
  Filled 2017-06-12: qty 200

## 2017-06-12 MED ORDER — METOPROLOL TARTRATE 5 MG/5ML IV SOLN: 5.0000 mg | INTRAVENOUS | Status: DC | PRN

## 2017-06-12 MED ORDER — DIGOXIN 0.25 MG/ML IJ SOLN
0.1250 mg | Freq: Once | INTRAMUSCULAR | Status: AC
  Administered 2017-06-12: 0.125 mg via INTRAVENOUS
  Filled 2017-06-12: qty 0.5

## 2017-06-12 MED ORDER — SODIUM CHLORIDE 0.9 % IV SOLN
INTRAVENOUS | Status: DC
  Administered 2017-06-12: 18:00:00 via INTRAVENOUS

## 2017-06-12 MED ORDER — METOPROLOL TARTRATE 5 MG/5ML IV SOLN
INTRAVENOUS | Status: AC
  Filled 2017-06-12: qty 5

## 2017-06-12 MED ORDER — INSULIN GLARGINE 100 UNIT/ML ~~LOC~~ SOLN
15.0000 [IU] | Freq: Every day | SUBCUTANEOUS | Status: DC
  Administered 2017-06-12: 15 [IU] via SUBCUTANEOUS
  Filled 2017-06-12 (×2): qty 0.15

## 2017-06-12 MED ORDER — AMIODARONE IV BOLUS ONLY 150 MG/100ML
150.0000 mg | Freq: Once | INTRAVENOUS | Status: AC
  Administered 2017-06-12: 150 mg via INTRAVENOUS
  Filled 2017-06-12: qty 100

## 2017-06-12 MED ORDER — AMIODARONE HCL IN DEXTROSE 360-4.14 MG/200ML-% IV SOLN
30.0000 mg/h | INTRAVENOUS | Status: DC
  Administered 2017-06-12 – 2017-06-13 (×4): 30 mg/h via INTRAVENOUS
  Filled 2017-06-12 (×3): qty 200

## 2017-06-12 MED ORDER — AMOXICILLIN-POT CLAVULANATE 875-125 MG PO TABS
1.0000 | ORAL_TABLET | Freq: Two times a day (BID) | ORAL | Status: DC
  Administered 2017-06-12 – 2017-06-15 (×6): 1 via ORAL
  Filled 2017-06-12 (×6): qty 1

## 2017-06-12 MED ORDER — ORAL CARE MOUTH RINSE
15.0000 mL | Freq: Two times a day (BID) | OROMUCOSAL | Status: DC
  Administered 2017-06-12 – 2017-06-15 (×4): 15 mL via OROMUCOSAL

## 2017-06-12 MED ORDER — INSULIN ASPART 100 UNIT/ML ~~LOC~~ SOLN
4.0000 [IU] | Freq: Three times a day (TID) | SUBCUTANEOUS | Status: DC
  Administered 2017-06-12: 4 [IU] via SUBCUTANEOUS
  Filled 2017-06-12: qty 1

## 2017-06-12 MED ORDER — SALINE SPRAY 0.65 % NA SOLN
1.0000 | NASAL | Status: DC | PRN
  Administered 2017-06-12: 1 via NASAL
  Filled 2017-06-12: qty 44

## 2017-06-12 MED ORDER — DIGOXIN 125 MCG PO TABS
0.1250 mg | ORAL_TABLET | Freq: Every day | ORAL | Status: DC
  Administered 2017-06-13 – 2017-06-15 (×3): 0.125 mg via ORAL
  Filled 2017-06-12 (×3): qty 1

## 2017-06-12 NOTE — Progress Notes (Addendum)
Docotr Gouru ordered 0.125g Digoxin once IV for pt HR 150. Will continue to monitor.

## 2017-06-12 NOTE — Progress Notes (Signed)
PT Cancellation Note  Patient Details Name: Ruben Gallegos MRN: [id number] DOB: Apr 02, 1921   Cancelled Treatment:    Reason Eval/Treat Not Completed: Medical issues which prohibited therapy; Per nursing will hold PT eval this date secondary to pt's elevated HR at rest.  Will attempt to see pt at a future date as medically appropriate.     Linus Salmons PT, DPT 06/12/17, 12:05 PM

## 2017-06-12 NOTE — Evaluation (Signed)
Clinical/Bedside Swallow Evaluation Patient Details  Name: Ruben Gallegos MRN: [id number] Date of Birth: June 06, 1920  Today's Date: 06/12/2017 Time: SLP Start Time (ACUTE ONLY): 1100 SLP Stop Time (ACUTE ONLY): 1200 SLP Time Calculation (min) (ACUTE ONLY): 60 min  Past Medical History:  Past Medical History:  Diagnosis Date  . Cancer ([place])   . CHF (congestive heart failure) ([place])   . Diabetes mellitus without complication ([place])   . GERD (gastroesophageal reflux disease)   . HLD (hyperlipidemia)   . Hypertension   . Hypothyroidism    Past Surgical History:  Past Surgical History:  Procedure Laterality Date  . APPENDECTOMY    . CATARACT EXTRACTION    . HERNIA REPAIR     HPI:  Pt is a 84 y.o. male with a known history of diabetes mellitus, hypertension, hypothyroidism, hyperlipidemia, chronic congestive heart failure is presenting to the ED with a chief complaint of shortness of breath, cough and congestion.  Denies any chest pain.  Patient was found to be hypoxic with 87% pulse ox on room air and 94% on 2 L, EKG with sinus tachycardia at 100 but lactic acid is normal.  Chest x-ray has revealed right lower lobe pneumonia.  Patient is started on cefepime and vancomycin and hospitalist team is called to admit the patient. Per family, pt has also been taking Lasix for increased "fluid" and felt somewhat better. Family also reported that pt's liquid are "thickened some" at home but he also drinks "some" liquids not thickened. CXR at admission: New ill-defined opacities at the right lung base, highly suspicious for pneumonia. Also has baseline Bilateral interstitial prominence chronic versus recurrent CHF/volume overload.    Assessment / Plan / Recommendation Clinical Impression  Pt appears to present w/ increased risk for aspiration (baseline for pt per report?) secondary to pharyngeal phase dysphagia. Per family report, pt has had a h/o needing to thicken his liquids at home - not clear on how  long or how thick. Per description, pt still consumes thin liquids however. During this eval, pt was given trials of Nectar consistency liquids via Cup. He fed self w/ min support and encouragement to take his time d/t quick SOB/WOB w/ any exertion. Pt consumed a few trials w/ no immediate, overt s/s of aspiration during/post trials. He stated the taste was "fine" but indicated the drinks at home were "slimey". Pt declined any further po trials of Nectar liquids or solids d/t his fatigue and discomfort. Thorough education given to family in room re: aspiration precautions, thickening of liquids to Nectar consistency, food and liquid options for easier intake(exertion), monitoring pt's cues and need for positioning. ST services will f/u to monitor pt's toleration of diet and need for any further modification or education. Recommend Dietician f/u for supplements. Information given to family on the Dysphagia Drink Cup - Ri[employer] Cup for better control of bolus and increased safety w/ liquids hopefully. SLP Visit Diagnosis: Dysphagia, pharyngeal phase (R13.13)    Aspiration Risk  Mild aspiration risk    Diet Recommendation  Dysphagia level 3 w/ NECTAR liquids; aspiration precautions; assistance at all meals. Monitoring pt's cues and give Rest Breaks when needed to lessen SOB/WOB.   Medication Administration: Whole meds with puree(for easier, safer swallowing)    Other  Recommendations Recommended Consults: (Dietician f/u) Oral Care Recommendations: Oral care BID;Staff/trained caregiver to provide oral care Other Recommendations: Order thickener from pharmacy;Prohibited food (jello, ice cream, thin soups);Remove water pitcher;Have oral suction available   Follow up Recommendations (TBD)  Frequency and Duration min 2-3x/week  1 week       Prognosis Prognosis for Safe Diet Advancement: Fair Barriers to Reach Goals: Time post onset;Severity of deficits(baseline dysphagia per family report)       Swallow Study   General Date of Onset: 06/09/17 HPI: Pt is a 84 y.o. male with a known history of diabetes mellitus, hypertension, hypothyroidism, hyperlipidemia, chronic congestive heart failure is presenting to the ED with a chief complaint of shortness of breath, cough and congestion.  Denies any chest pain.  Patient was found to be hypoxic with 87% pulse ox on room air and 94% on 2 L, EKG with sinus tachycardia at 100 but lactic acid is normal.  Chest x-ray has revealed right lower lobe pneumonia.  Patient is started on cefepime and vancomycin and hospitalist team is called to admit the patient. Per family, pt has also been taking Lasix for increased "fluid" and felt somewhat better. Family also reported that pt's liquid are "thickened some" at home but he also drinks "some" liquids not thickened. CXR at admission: New ill-defined opacities at the right lung base, highly suspicious for pneumonia. Also has baseline Bilateral interstitial prominence chronic versus recurrent CHF/volume overload.  Type of Study: Bedside Swallow Evaluation Previous Swallow Assessment: none reported - but family stated pt is on "thickened liquids at home" and that he has been using "thickener in his drinks for awhile"(not sure the consistency).  Diet Prior to this Study: Regular;Thin liquids Temperature Spikes Noted: No(wbc 9.2) Respiratory Status: (HFNC 6-45 liters) History of Recent Intubation: No Behavior/Cognition: Alert;Cooperative;Pleasant mood;Distractible;Requires cueing Oral Cavity Assessment: Within Functional Limits;Dry Oral Care Completed by SLP: Recent completion by staff Oral Cavity - Dentition: Adequate natural dentition Vision: Functional for self-feeding Self-Feeding Abilities: Able to feed self;Needs set up;Needs assist(overall weakness) Patient Positioning: Upright in bed Baseline Vocal Quality: Normal(SOB w/ any exertion) Volitional Cough: Strong;Congested Volitional Swallow: Able to elicit     Oral/Motor/Sensory Function Overall Oral Motor/Sensory Function: Within functional limits   Ice Chips Ice chips: Not tested   Thin Liquid Thin Liquid: Not tested    Nectar Thick Nectar Thick Liquid: Within functional limits Presentation: Cup;Self Fed(4 trials) Other Comments: declined further   Honey Thick Honey Thick Liquid: Not tested   Puree Puree: Not tested   Solid   GO   Solid: Not tested         Orinda Kenner, MS, CCC-SLP [employer] 06/12/2017,1:06 PM

## 2017-06-12 NOTE — Progress Notes (Signed)
Pt BP is at 86/66 HR 118 and is asymptomatic. Will continue to monitor.

## 2017-06-12 NOTE — Progress Notes (Signed)
Pt has coughing spell after each bite of food. Doctor Gouru was notified and ordered to have speech therapy to evaluate pt. Will continue to monitor

## 2017-06-12 NOTE — Care Management (Signed)
Barrier to discharge- uncontrolled heart rate. IV digoxin, Amiodarone drip. PT consult pending

## 2017-06-12 NOTE — Progress Notes (Signed)
CCMD called and pt HR at 160. Page doctor Gouru. She did tell me to go ahead and call Doctor Fath. Will continue to monitor.

## 2017-06-12 NOTE — Progress Notes (Signed)
Pt and pts family requesting to have Jello/ Speech called to reqeust/ states that she does not feel comfortable with pt having jello/ family still request/ Dr. Margaretmary Eddy called to make aware/ MD spoke with family via phone about risk of aspiration if pt eats jello/ daughter states she understands and still want to feed him jello/ palliative care consult order placed/ will monitor closely

## 2017-06-12 NOTE — Progress Notes (Signed)
Pt HR still at 130's and 140'S after Amiodarone bolus. Notified doctor fath and he states he will place an order for Amiodarone drip. Awaiting order. Will continue to monitor.

## 2017-06-12 NOTE — Consult Note (Signed)
Cardiology Consultation Note    Patient ID: Ruben Gallegos, MRN: [id number], DOB/AGE: 1920/08/02 84 y.o. Admit date: 06/09/2017   Date of Consult: 06/12/2017 Primary Physician: [name], MD Primary Cardiologist: Dr. Nehemiah Massed  Chief Complaint: sob Reason for Consultation: afib with rvr Requesting MD: Dr. Margaretmary Eddy  HPI: Ruben Gallegos is a 84 y.o. male with history of hypertension, diabetes, hyperlipidemia who was admitted with increasing shortness of breath.  Admission electrocardiogram showed sinus rhythm with first-degree AV block and left bundle branch block.  Chest x-ray revealed probable right lower lobe pneumonia.  He was treated with empiric antibiotics.  He also was continued with metoprolol 12.5 mg twice daily.  He had an echocardiogram showing severely reduced LV function EF 20-25%.  This is not changed from echo done in 2017.  He was continued with short course of steroids, bronchodilators.  He was treated with vancomycin and cefepime.  Shortness of breath increased on admission day 3.  It was felt to be secondary to volume overload.  He was continued with bronchodilators and given IV Lasix.  He improved over the next 24 hours however this a.m. was noted to go into A. fib with rapid ventricular response.  This was associated with increasing shortness of breath.  Due to relative hypotension he was given IV digoxin x2 with no improvement.  He then was placed on amiodarone with an amiodarone bolus.  He was given 150 mg x1.  His heart rate has improved somewhat.  Patient complains of shortness of breath and chest tightness.  Past Medical History:  Diagnosis Date  . Cancer ([place])   . CHF (congestive heart failure) ([place])   . Diabetes mellitus without complication ([place])   . GERD (gastroesophageal reflux disease)   . HLD (hyperlipidemia)   . Hypertension   . Hypothyroidism       Surgical History:  Past Surgical History:  Procedure Laterality Date  . APPENDECTOMY    . CATARACT  EXTRACTION    . HERNIA REPAIR       Home Meds: Prior to Admission medications   Medication Sig Start Date End Date Taking? Authorizing Provider  carvedilol (COREG) 3.125 MG tablet Take 3.125 mg by mouth 2 (two) times daily.   Yes [provider]  Cholecalciferol (VITAMIN D-3) 1000 units CAPS Take 1,000 Units by mouth daily.   Yes [provider]  esomeprazole (NEXIUM) 40 MG capsule Take 40 mg by mouth daily.   Yes [provider]  furosemide (LASIX) 40 MG tablet Take 1 tablet (40 mg total) by mouth daily. Patient taking differently: Take 80 mg by mouth daily.  08/27/15  Yes Demetrios Loll, MD  glipiZIDE (GLUCOTROL) 10 MG tablet Take 10 mg by mouth daily before breakfast.    Yes [provider]  insulin glargine (LANTUS) 100 UNIT/ML injection Inject 12-26 Units into the skin at bedtime. 12UNITS-AM/26UNITS-PM   Yes [provider]  isosorbide mononitrate (IMDUR) 30 MG 24 hr tablet Take 1 tablet (30 mg total) by mouth daily. 04/15/16  Yes Epifanio Lesches, MD  levothyroxine (SYNTHROID, LEVOTHROID) 100 MCG tablet Take 100 mcg by mouth daily.   Yes [provider]  mirtazapine (REMERON) 7.5 MG tablet Take 7.5 mg by mouth daily.   Yes [provider]  potassium chloride (K-DUR) 10 MEQ tablet Take 10 mEq by mouth daily.   Yes [provider]  spironolactone (ALDACTONE) 25 MG tablet Take 12.5 mg by mouth daily.   Yes [provider]  XARELTO 20 MG TABS tablet Take 20 mg by mouth daily.   Yes [provider]  acetaminophen (TYLENOL) 325 MG tablet Take 325 mg by mouth 3 (three) times daily.    [provider]  mupirocin ointment (BACTROBAN) 2 % Place 1 application into the nose 2 (two) times daily. 04/14/16   Epifanio Lesches, MD  nitroGLYCERIN (NITROSTAT) 0.4 MG SL tablet Place 0.4 mg under the tongue every 5 (five) minutes as needed for chest pain.    [provider]    Inpatient Medications:   . cholecalciferol  1,000 Units Oral Daily  . [START ON 06/13/2017] digoxin  0.125 mg Oral Daily  . furosemide  20 mg Oral Daily  . insulin aspart  0-15 Units Subcutaneous TID WC  . insulin aspart  0-5 Units Subcutaneous QHS  . insulin aspart  4 Units Subcutaneous TID WC  . insulin glargine  15 Units Subcutaneous Daily  . ipratropium  0.5 mg Nebulization Q6H  . levalbuterol  0.63 mg Nebulization Q6H  . levothyroxine  100 mcg Oral QAC breakfast  . methylPREDNISolone (SOLU-MEDROL) injection  40 mg Intravenous Q8H  . metoprolol tartrate      . metoprolol tartrate  12.5 mg Oral BID  . mirtazapine  7.5 mg Oral Daily  . pantoprazole  40 mg Oral Daily  . rivaroxaban  20 mg Oral Daily  . sodium chloride flush  3 mL Intravenous Q12H   . ceFEPime (MAXIPIME) IV Stopped (06/12/17 1143)    Allergies:  Allergies  Allergen Reactions  . Altace [Ramipril] Shortness Of Breath  . Tramadol Shortness Of Breath  . Actos [Pioglitazone] Swelling  . Codeine Nausea Only  . Demerol [Meperidine] Nausea Only  . Lipitor [Atorvastatin] Other (See Comments)  . Metformin And Related Other (See Comments)  . Mevacor [Lovastatin] Other (See Comments)    Social History   Socioeconomic History  . Marital status: Widowed    Spouse name: Not on file  . Number of children: Not on file  . Years of education: Not on file  . Highest education level: Not on file  Social Needs  . Financial resource strain: Not on file  . Food insecurity - worry: Not on file  . Food insecurity - inability: Not on file  . Transportation needs - medical: Not on file  . Transportation needs - non-medical: Not on file  Occupational History  . Occupation: retired  Tobacco Use  . Smoking status: Former Research scientist (life sciences)  . Smokeless tobacco: Never Used  Substance and Sexual Activity  . Alcohol use: Yes    Alcohol/week: 0.0 oz  . Drug use: No  . Sexual activity: Not on file  Other Topics Concern  . Not on file  Social History Narrative   . Not on file     Family History  Problem Relation Age of Onset  . CAD Unknown   . Diabetes Unknown   . Diabetes Mother   . CVA Father      Review of Systems: A 12-system review of systems was performed and is negative except as noted in the HPI.  Labs: Recent Labs    06/10/17 1521 06/10/17 2105 06/11/17 0336  TROPONINI 0.11* 0.09* 0.10*   Lab Results  Component Value Date   WBC 9.7 06/12/2017   HGB 14.2 06/12/2017   HCT 41.9 06/12/2017   MCV 94.5 06/12/2017   PLT 169 06/12/2017    Recent Labs  Lab 06/09/17 1020 06/11/17 0336  NA 135 137  K 3.8  3.6  CL 102 105  CO2 23 22  BUN 19 22*  CREATININE 1.09 0.94  CALCIUM 8.4* 8.3*  PROT 7.7  --   BILITOT 1.1  --   ALKPHOS 60  --   ALT 18  --   AST 29  --   GLUCOSE 186* 350*   Lab Results  Component Value Date   CHOL 150 04/14/2016   HDL 28 (L) 04/14/2016   LDLCALC 95 04/14/2016   TRIG 137 04/14/2016   No results found for: DDIMER  Radiology/Studies:  Dg Chest 1 View  Result Date: 06/10/2017 CLINICAL DATA:  Pneumonia, shortness of breath, cough EXAM: CHEST 1 VIEW COMPARISON:  06/09/2017 FINDINGS: Elevation of the right hemidiaphragm. Cardiomegaly. Diffuse interstitial prominence throughout the lungs appears worsened and may reflect interstitial edema. Bilateral lower lobe airspace opacities, slightly increased on the left since prior study, stable on the right. IMPRESSION: Cardiomegaly with diffuse interstitial prominence, likely interstitial edema. Stable elevation of the right hemidiaphragm. Bibasilar opacities, worsening on the left since prior study concerning for pneumonia. Electronically Signed   By: Rolm Baptise M.D.   On: 06/10/2017 10:24   Dg Chest Port 1 View  Result Date: 06/11/2017 CLINICAL DATA:  Cough, persistent. eval for possible aspiration. EXAM: PORTABLE CHEST 1 VIEW COMPARISON:  06/10/2017 FINDINGS: There is persistent lung base opacity bilaterally, consistent with atelectasis, pneumonia or a  combination. Mild cardiomegaly and bilateral interstitial thickening are noted similar to the previous day's study. No visualized pleural effusion. No pneumothorax. IMPRESSION: 1. No significant change from the previous day's study. Persistent lung base opacities that may reflect atelectasis, pneumonia or a combination of both. 2. Mild cardiomegaly and bilateral interstitial thickening. Mild congestive heart failure is suspected. Electronically Signed   By: Lajean Manes M.D.   On: 06/11/2017 18:51   Dg Chest Port 1 View  Result Date: 06/09/2017 CLINICAL DATA:  Pt having flu like symptoms for 7 days. Hx of cancer, CHF, diabetes, hypertension. Former smoker EXAM: PORTABLE CHEST 1 VIEW COMPARISON:  Chest x-rays dated 04/13/2016 and 08/24/2015 FINDINGS: Ill-defined opacities overlying the elevated right hemidiaphragm, highly suspicious for pneumonia. Diffuse bilateral interstitial prominence, similar to previous exam, suspected mild chronic versus recurrent interstitial edema. Heart size and mediastinal contours are stable. Atherosclerotic changes again noted at the aortic arch. No pneumothorax or large pleural effusion seen. No acute or suspicious osseous finding. IMPRESSION: 1. New ill-defined opacities at the right lung base, highly suspicious for pneumonia. 2. Bilateral interstitial prominence, presumably interstitial edema, similar to previous exams suggesting mild chronic versus recurrent CHF/volume overload. 3. Aortic atherosclerosis. Electronically Signed   By: Franki Cabot M.D.   On: 06/09/2017 10:45    Wt Readings from Last 3 Encounters:  06/10/17 77.3 kg (170 lb 6.4 oz)  04/13/16 78.5 kg (173 lb)  08/27/15 76.6 kg (168 lb 12.8 oz)    EKG: Initially on his rhythm followed by atrial fibrillation with rapid ventricular response.  Currently rate is improved after IV amiodarone.  Physical Exam:  Blood pressure 94/78, pulse (!) 130, temperature 97.6 F (36.4 C), temperature source Oral, resp. rate  18, height 6\' 1"  (1.854 m), weight 77.3 kg (170 lb 6.4 oz), SpO2 98 %. Body mass index is 22.48 kg/m. General: Well developed, well nourished, in no acute distress. Head: Normocephalic, atraumatic, sclera non-icteric, no xanthomas, nares are without discharge.  Neck: Negative for carotid bruits. JVD not elevated. Lungs: Decreased breath sounds bilaterally with bilateral wheezes and rhonchi. Heart: Irregular rhythm.  2/6 systolic  murmur radiating to the outflow tract.  No obvious diastolic murmur. Abdomen: Soft, non-tender, non-distended with normoactive bowel sounds. No hepatomegaly. No rebound/guarding. No obvious abdominal masses. Msk:  Strength and tone appear normal for age. Extremities: No clubbing or cyanosis. No edema.  Distal pedal pulses are 2+ and equal bilaterally. Neuro: Alert and oriented X 3. No facial asymmetry. No focal deficit. Moves all extremities spontaneously. Psych:  Responds to questions appropriately with a normal affect.     Assessment and Plan  84 year old male with history of cardiomyopathy with a known EF of 15-20% admitted with right lower lobe community-acquired pneumonia.  Now has developed atrial fibrillation a rapid ventricular response.  Has been relatively hypotensive.  This did not respond immediately to IV digoxin.  Has received IV amiodarone load with improvement in his heart rate with transient what appears to be sinus rhythm.  We will continue with empiric antibiotics.  Would continue with bronchodilators.  Etiology of the A. fib is likely due to his pneumonia.  We will determine whether or not to proceed with long-term p.o. amiodarone therapy.  Chronic anticoagulation is also somewhat high risk given his advanced age and fall risk with bleeding risk.  We will follow his response to the amiodarone bolus and for evidence of further dysrhythmias whether hemodynamics.  Consideration for long-term anticoagulation or long-term use of amiodarone will be discussed  prior to discharge. Signed, Teodoro Spray MD 06/12/2017, 1:53 PM Pager: [phone]

## 2017-06-12 NOTE — Progress Notes (Signed)
CCMD called that PT HR up to 140's and 150's then went to A-fib. Doctor Gouru was called and ordered Metropolol IV 5mg / 31ml but was not given due to pt BP of 104/61. Docotr Gouru then ordered to give one time dose of 0.125 mg IV to help control rate. Will continue to monitor

## 2017-06-12 NOTE — Progress Notes (Signed)
PT Hr still at  130's to 150's. Notified Doctor Gouru. Awaiting callback. Will continue to monitor.

## 2017-06-12 NOTE — Progress Notes (Signed)
Doctor Ubaldo Glassing was called about opt HR of 150's. He states that he will place the order for amiodarone. Will cotinue to monitor.

## 2017-06-12 NOTE — Plan of Care (Signed)
  Progressing Clinical Measurements: Ability to maintain clinical measurements within normal limits will improve 06/12/2017 1644 - Progressing by Liliane Channel, RN 06/12/2017 1632 - Progressing by Liliane Channel, RN Respiratory complications will improve 06/12/2017 1644 - Progressing by Liliane Channel, RN 06/12/2017 1632 - Progressing by Liliane Channel, RN Cardiovascular complication will be avoided 06/12/2017 1644 - Progressing by Liliane Channel, RN 06/12/2017 1632 - Progressing by Liliane Channel, RN Elimination: Will not experience complications related to bowel motility 06/12/2017 1632 - Progressing by Liliane Channel, RN Pain Managment: General experience of comfort will improve 06/12/2017 1644 - Progressing by Liliane Channel, RN 06/12/2017 1632 - Progressing by Liliane Channel, RN Safety: Ability to remain free from injury will improve 06/12/2017 1644 - Progressing by Liliane Channel, RN 06/12/2017 1632 - Progressing by Liliane Channel, RN Skin Integrity: Risk for impaired skin integrity will decrease 06/12/2017 1644 - Progressing by Liliane Channel, RN

## 2017-06-12 NOTE — Plan of Care (Signed)
  Progressing Clinical Measurements: Ability to maintain clinical measurements within normal limits will improve 06/12/2017 1632 - Progressing by Liliane Channel, RN Respiratory complications will improve 06/12/2017 1632 - Progressing by Liliane Channel, RN Cardiovascular complication will be avoided 06/12/2017 1632 - Progressing by Liliane Channel, RN Elimination: Will not experience complications related to bowel motility 06/12/2017 1632 - Progressing by Liliane Channel, RN Pain Managment: General experience of comfort will improve 06/12/2017 1632 - Progressing by Liliane Channel, RN Safety: Ability to remain free from injury will improve 06/12/2017 1632 - Progressing by Liliane Channel, RN

## 2017-06-12 NOTE — Progress Notes (Signed)
[facility] [place] NAME: Ruben Gallegos    MR#:  [id number]  DATE OF BIRTH:  12-16-1920  SUBJECTIVE:  CHIEF COMPLAINT: Patient is into atrial fibrillation with RVR.  Reporting chest discomfort.  Daughter at bedside  REVIEW OF SYSTEMS:  CONSTITUTIONAL: No fever, fatigue or weakness.  EYES: No blurred or double vision.  EARS, NOSE, AND THROAT: No tinnitus or ear pain.  RESPIRATORY: Reporting improving of cough, shortness of breath, wheezing, denies hemoptysis.  CARDIOVASCULAR: No chest pain, orthopnea, edema.  GASTROINTESTINAL: No nausea, vomiting, diarrhea or abdominal pain.  GENITOURINARY: No dysuria, hematuria.  ENDOCRINE: No polyuria, nocturia,  HEMATOLOGY: No anemia, easy bruising or bleeding SKIN: No rash or lesion. MUSCULOSKELETAL: No joint pain or arthritis.   NEUROLOGIC: No tingling, numbness, weakness.  PSYCHIATRY: No anxiety or depression.   DRUG ALLERGIES:   Allergies  Allergen Reactions  . Altace [Ramipril] Shortness Of Breath  . Tramadol Shortness Of Breath  . Actos [Pioglitazone] Swelling  . Codeine Nausea Only  . Demerol [Meperidine] Nausea Only  . Lipitor [Atorvastatin] Other (See Comments)  . Metformin And Related Other (See Comments)  . Mevacor [Lovastatin] Other (See Comments)    VITALS:  Blood pressure 113/75, pulse (!) 128, temperature 97.6 F (36.4 C), temperature source Oral, resp. rate 18, height _0  (1.854 m), weight 77.3 kg (170 lb 6.4 oz), SpO2 96 %.  PHYSICAL EXAMINATION:  GENERAL:  84 y.o.-year-old patient lying in the bed with no acute distress.  EYES: Pupils equal, round, reactive to light and accommodation. No scleral icterus. Extraocular muscles intact.  HEENT: Head atraumatic, normocephalic. Oropharynx and nasopharynx clear.  NECK:  Supple, no jugular venous distention. No thyroid enlargement, no tenderness.  LUNGS: Moderate breath sounds bilaterally, no  wheezing, rales,rhonchi or  crepitation.  no   use of accessory muscles of respiration.  CARDIOVASCULAR: Irregularly regular no murmurs, rubs, or gallops.  ABDOMEN: Soft, nontender, nondistended. Bowel sounds present.  EXTREMITIES: No pedal edema, cyanosis, or clubbing.  NEUROLOGIC: Cranial nerves are intact. Muscle strength generalized weakness in all extremities. Sensation intact. Gait not checked.  PSYCHIATRIC: The patient is alert and oriented x 3.  SKIN: No obvious rash, lesion, or ulcer.    LABORATORY PANEL:   CBC Recent Labs  Lab 06/12/17 0510  WBC 9.7  HGB 14.2  HCT 41.9  PLT 169   ------------------------------------------------------------------------------------------------------------------  Chemistries  Recent Labs  Lab 06/09/17 1020 06/11/17 0336  NA 135 137  K 3.8 3.6  CL 102 105  CO2 23 22  GLUCOSE 186* 350*  BUN 19 22*  CREATININE 1.09 0.94  CALCIUM 8.4* 8.3*  AST 29  --   ALT 18  --   ALKPHOS 60  --   BILITOT 1.1  --    ------------------------------------------------------------------------------------------------------------------  Cardiac Enzymes Recent Labs  Lab 06/11/17 0336  TROPONINI 0.10*   ------------------------------------------------------------------------------------------------------------------  RADIOLOGY:  Dg Chest Port 1 View  Result Date: 06/11/2017 CLINICAL DATA:  Cough, persistent. eval for possible aspiration. EXAM: PORTABLE CHEST 1 VIEW COMPARISON:  06/10/2017 FINDINGS: There is persistent lung base opacity bilaterally, consistent with atelectasis, pneumonia or a combination. Mild cardiomegaly and bilateral interstitial thickening are noted similar to the previous day's study. No visualized pleural effusion. No pneumothorax. IMPRESSION: 1. No significant change from the previous day's study. Persistent lung base opacities that may reflect atelectasis, pneumonia or a combination of both. 2. Mild cardiomegaly and bilateral interstitial thickening. Mild  congestive heart failure is suspected. Electronically Signed  By: Lajean Manes M.D.   On: 06/11/2017 18:51    EKG:   Orders placed or performed during the hospital encounter of 06/09/17  . EKG 12-Lead  . EKG 12-Lead  . EKG  . EKG 12-Lead  . EKG 12-Lead  . EKG 12-Lead  . EKG 12-Lead  . EKG 12-Lead  . EKG 12-Lead    ASSESSMENT AND PLAN:  #Atrial fibrillation with RVR Patient is hypotensive too Patient is given digoxin IV and started on digoxin p.o. Amiodarone bolus and amiodarone drip Cardiology consulted and discussed with Dr. Ubaldo Glassing appreciate his recommendations   #Acute hypoxic respiratory failure secondary to pneumonia with bronchoconstriction Weaned off Ventimask to 5 L of oxygen via nasal cannula Continue IV antibiotics and Solu-Medrol for bronchoconstriction will be  taper as patient is clinically improving Bronchodilator treatment IV Lasix for fluid overload will be changed to p.o. Lasix spirometry  #Sepsis with right lower lobe pneumonia Patient met septic criteria with hypotension, tachycardia and tachypnea at the time of admission Patient is started on cefepime and vancomycin in the emergency department will continue the same Status post hydration with IV fluids Lactic acid is in the normal range   #elevated troponin-probably demand ischemia  patient seems to  has chronically elevated troponin when compared to previous labs We will cycle cardiac biomarkers and monitor patient closely Initial troponin  0.15 but patient is denying any chest pain  #Chronic systolic CHF previous ejection fraction was 15% Continue close monitoring of daily weights and intake and output Lasix 20 IV once daily Repeat echocardiogram-20-25% ejection fraction.  No pericardial effusion.  #Hypothyroidism continue Synthyroid   #Chronic history of DVT-continue Xarelto his home medication  #Acute COPD-on steroids and bronchodilator treatments Albuterol changed to Xopenex in view  of tachycardia  #Diabetes mellitus currently patient is hypoglycemic will hold off on the Lantus and Glucotrol and-provide sliding scale insulin      All the records are reviewed and case discussed with Care Management/Social Workerr. Management plans discussed with the patient, son and daughter at bedside and they are in agreement.  CODE STATUS: DNR   TOTAL CRITICALCARE TIME TAKING CARE OF THIS PATIENT: 41  minutes.   POSSIBLE D/C IN1- 2  DAYS, DEPENDING ON CLINICAL CONDITION.  Note: This dictation was prepared with Dragon dictation along with smaller phrase technology. Any transcriptional errors that result from this process are unintentional.   [name] M.D on 06/12/2017 at 3:31 PM  Between 7am to 6pm - Pager - [phone] After 6pm go to [email or web address] - password [email or web address] [facility]  [place] [facility]  Office  [phone]  CC: Primary care physician; [name], MD

## 2017-06-12 NOTE — Progress Notes (Addendum)
Inpatient Diabetes Program Recommendations  AACE/ADA: New Consensus Statement on Inpatient Glycemic Control (2015)  Target Ranges:  Prepandial:   less than 140 mg/dL      Peak postprandial:   less than 180 mg/dL (1-2 hours)      Critically ill patients:  140 - 180 mg/dL   Results for THURSTON, BRENDLINGER (MRN [id number]) as of 06/12/2017 08:01  Ref. Range 06/11/2017 07:41 06/11/2017 12:00 06/11/2017 16:43 06/11/2017 21:22  Glucose-Capillary Latest Ref Range: 65 - 99 mg/dL 299 (H) 348 (H) 323 (H) 237 (H)  Results for DEVAUGHN, SAVANT (MRN [id number]) as of 06/12/2017 08:01  Ref. Range 06/10/2017 05:37  Hemoglobin A1C Latest Ref Range: 4.8 - 5.6 % 7.8 (H)   Review of Glycemic Control  HomeDM Meds: Lantus 12unitsQAM, Lantus26 unitsQPM,Glipizide 10 mg daily CurrentOrders:Novolog 0-15 units TID, Novolog 0-5 units QHS, Lantus 10 units daily; Solumedrol 40 mg Q8H  Inpatient Diabetes Program Recommendations: Insulin-Basal: Please consider increasing Lantus to 15 units QAM. Insulin-Meal Coverage: Please consider ordering Novolog 4 units TID with meals for meal coverage if patient is eating at least 50% of meals.  Thanks, Barnie Alderman, RN, MSN, CDE Diabetes Coordinator Inpatient Diabetes Program [phone] (Team Pager from 8am to 5pm)

## 2017-06-12 NOTE — Plan of Care (Signed)
No complaints of pain last night, pt continues on the HI FLOW [place], respiratory did turn him down a little this morning. Son at bedside. Pt received a bath this morning. Ordered pt saline nose spray & claritin from the nursing standing orders last night due to some congestion.

## 2017-06-12 NOTE — Progress Notes (Signed)
Pt BP was at 88/65 HR 120 and asymptomatic. Doctor Ubaldo Glassing made aware. He states that hes okay if pt HR at systolic at 80 as long as pt asymptomatic. Will continue to monitor.

## 2017-06-13 DIAGNOSIS — I5023 Acute on chronic systolic (congestive) heart failure: Secondary | ICD-10-CM

## 2017-06-13 DIAGNOSIS — R0902 Hypoxemia: Secondary | ICD-10-CM

## 2017-06-13 DIAGNOSIS — Z515 Encounter for palliative care: Secondary | ICD-10-CM

## 2017-06-13 DIAGNOSIS — A419 Sepsis, unspecified organism: Principal | ICD-10-CM

## 2017-06-13 DIAGNOSIS — J189 Pneumonia, unspecified organism: Secondary | ICD-10-CM

## 2017-06-13 LAB — GLUCOSE, CAPILLARY
GLUCOSE-CAPILLARY: 277 mg/dL — AB (ref 65–99)
GLUCOSE-CAPILLARY: 86 mg/dL (ref 65–99)
Glucose-Capillary: 307 mg/dL — ABNORMAL HIGH (ref 65–99)
Glucose-Capillary: 155 mg/dL — ABNORMAL HIGH (ref 65–99)

## 2017-06-13 MED ORDER — OXYMETAZOLINE HCL 0.05 % NA SOLN: 1.0000 | Freq: Two times a day (BID) | NASAL | Status: DC | PRN

## 2017-06-13 MED ORDER — POLYETHYLENE GLYCOL 3350 17 G PO PACK
17.0000 g | PACK | Freq: Every day | ORAL | Status: DC
  Administered 2017-06-13 – 2017-06-15 (×3): 17 g via ORAL
  Filled 2017-06-13 (×4): qty 1

## 2017-06-13 MED ORDER — INSULIN ASPART 100 UNIT/ML ~~LOC~~ SOLN
7.0000 [IU] | Freq: Three times a day (TID) | SUBCUTANEOUS | Status: DC
  Administered 2017-06-13 – 2017-06-14 (×4): 7 [IU] via SUBCUTANEOUS
  Filled 2017-06-13 (×4): qty 1

## 2017-06-13 MED ORDER — METHYLPREDNISOLONE SODIUM SUCC 40 MG IJ SOLR
40.0000 mg | Freq: Every day | INTRAMUSCULAR | Status: DC
  Administered 2017-06-14 – 2017-06-15 (×2): 40 mg via INTRAVENOUS
  Filled 2017-06-13 (×2): qty 1

## 2017-06-13 MED ORDER — INSULIN GLARGINE 100 UNIT/ML ~~LOC~~ SOLN
20.0000 [IU] | Freq: Every day | SUBCUTANEOUS | Status: DC
  Administered 2017-06-13 – 2017-06-14 (×2): 20 [IU] via SUBCUTANEOUS
  Filled 2017-06-13 (×3): qty 0.2

## 2017-06-13 MED ORDER — MORPHINE SULFATE (PF) 2 MG/ML IV SOLN
1.0000 mg | INTRAVENOUS | Status: DC | PRN
  Administered 2017-06-13: 1 mg via INTRAVENOUS
  Filled 2017-06-13: qty 1

## 2017-06-13 NOTE — Plan of Care (Signed)
Patient condition progressing , weaning off high flow  oxygen to [place] , patient tolerating

## 2017-06-13 NOTE — Progress Notes (Signed)
Inpatient Diabetes Program Recommendations  AACE/ADA: New Consensus Statement on Inpatient Glycemic Control (2015)  Target Ranges:  Prepandial:   less than 140 mg/dL      Peak postprandial:   less than 180 mg/dL (1-2 hours)      Critically ill patients:  140 - 180 mg/dL   Results for TAY, WHITWELL (MRN [id number]) as of 06/13/2017 07:57  Ref. Range 06/12/2017 07:58 06/12/2017 11:42 06/12/2017 17:27 06/12/2017 21:13 06/13/2017 07:53  Glucose-Capillary Latest Ref Range: 65 - 99 mg/dL 301 (H) 331 (H) 225 (H) 271 (H) 277 (H)   Review of Glycemic Control HomeDM Meds: Lantus 12unitsQAM, Lantus26 unitsQPM,Glipizide 10 mg daily CurrentOrders:Novolog 0-15 units TID, Novolog 0-5 units QHS, Lantus 15 units daily, Novolog 4 units TID with meals for meal coverage; Solumedrol 40 mg Q8H  Inpatient Diabetes Program Recommendations: Insulin-Basal: Please consider increasing Lantus to 20 units QAM. Insulin-Meal Coverage: Please consider ordering Novolog 7 units TID with meals for meal coverage if patient is eating at least 50% of meals  Thanks, Barnie Alderman, RN, MSN, CDE Diabetes Coordinator Inpatient Diabetes Program [phone] (Team Pager from 8am to 5pm)

## 2017-06-13 NOTE — Progress Notes (Signed)
Patient presently lying in the bed, alert and oriented, denies any pain, remains on high flow oxygen via [place], tolerating setting. Family at bedside. Patient remains on amiodarone drip at 16.6 ML /hr HR 107 on the monitor. Patient and family to consult with palliative care team as per MD note, rounded with MD at bedside, will continue to monitor

## 2017-06-13 NOTE — Progress Notes (Signed)
Pt called this RN in the room stating he feels like he cant breathe as good, pt had normal saline infusing at 74ml/hr due to low BP from earlier in the day, pt's lung sounds had coarse crackles, pt does have a history of CHF. MD paged, Dr. Marcille Blanco gave the okay to stop IV fluids. Pt's BP still on the soft side. Will continue to monitor. Conley Simmonds, RN, BSN  Vitals:   06/13/17 0415 06/13/17 0449  BP: 102/74 113/87  Pulse: (!) 51 (!) 47  Resp: (!) 33   Temp: 97.7 F (36.5 C) 97.6 F (36.4 C)  SpO2: 98% 90%

## 2017-06-13 NOTE — Progress Notes (Signed)
Patient Name: Ruben Gallegos Date of Encounter: 06/13/2017  Hospital Problem List     Active Problems:   PNA (pneumonia)    Patient Profile     Still with coughing on occasion and shortness of breath.  Patient with atrial fibrillation with rapid ventricular response improved with IV amiodarone.  Blood pressure still somewhat low.  Asymptomatic.  Subjective   Mild shortness of breath with cough.  Inpatient Medications    . amoxicillin-clavulanate  1 tablet Oral Q12H  . cholecalciferol  1,000 Units Oral Daily  . digoxin  0.125 mg Oral Daily  . insulin aspart  0-15 Units Subcutaneous TID WC  . insulin aspart  0-5 Units Subcutaneous QHS  . insulin aspart  7 Units Subcutaneous TID WC  . insulin glargine  20 Units Subcutaneous Daily  . ipratropium  0.5 mg Nebulization Q6H  . levalbuterol  0.63 mg Nebulization Q6H  . levothyroxine  100 mcg Oral QAC breakfast  . mouth rinse  15 mL Mouth Rinse BID  . [START ON 06/14/2017] methylPREDNISolone (SOLU-MEDROL) injection  40 mg Intravenous Daily  . mirtazapine  7.5 mg Oral Daily  . pantoprazole  40 mg Oral Daily  . polyethylene glycol  17 g Oral Daily  . rivaroxaban  20 mg Oral Daily  . sodium chloride flush  3 mL Intravenous Q12H    Vital Signs    Vitals:   06/13/17 0149 06/13/17 0415 06/13/17 0449 06/13/17 0748  BP:  102/74 113/87 94/62  Pulse:  (!) 118 (!) 123 (!) 123  Resp:  (!) 33  (!) 22  Temp:  97.7 F (36.5 C) 97.6 F (36.4 C) 97.9 F (36.6 C)  TempSrc:  Oral Oral Oral  SpO2: 95% 98% 90% 98%  Weight:   76.9 kg (169 lb 9.6 oz)   Height:        Intake/Output Summary (Last 24 hours) at 06/13/2017 1103 Last data filed at 06/13/2017 0451 Gross per 24 hour  Intake 1403.96 ml  Output 330 ml  Net 1073.96 ml   Filed Weights   06/09/17 1853 06/10/17 0435 06/13/17 0449  Weight: 79.1 kg (174 lb 6.1 oz) 77.3 kg (170 lb 6.4 oz) 76.9 kg (169 lb 9.6 oz)    Physical Exam    GEN: Well nourished, well developed, in no acute  distress.  HEENT: normal.  Neck: Supple, no JVD, carotid bruits, or masses. Cardiac: Irregular regular, no murmurs, rubs, or gallops. No clubbing, cyanosis, edema.  Radials/DP/PT 2+ and equal bilaterally.  Respiratory: Inspiratory and expiratory rhonchi with wheezing. GI: Soft, nontender, nondistended, BS + x 4. MS: no deformity or atrophy. Skin: warm and dry, no rash. Neuro:  Strength and sensation are intact. Psych: Normal affect.  Labs    CBC Recent Labs    06/12/17 0510  WBC 9.7  HGB 14.2  HCT 41.9  MCV 94.5  PLT 657   Basic Metabolic Panel Recent Labs    06/11/17 0336  NA 137  K 3.6  CL 105  CO2 22  GLUCOSE 350*  BUN 22*  CREATININE 0.94  CALCIUM 8.3*   Liver Function Tests No results for input(s): AST, ALT, ALKPHOS, BILITOT, PROT, ALBUMIN in the last 72 hours. No results for input(s): LIPASE, AMYLASE in the last 72 hours. Cardiac Enzymes Recent Labs    06/10/17 1521 06/10/17 2105 06/11/17 0336  TROPONINI 0.11* 0.09* 0.10*   BNP No results for input(s): BNP in the last 72 hours. D-Dimer No results for input(s): DDIMER in  the last 72 hours. Hemoglobin A1C No results for input(s): HGBA1C in the last 72 hours. Fasting Lipid Panel No results for input(s): CHOL, HDL, LDLCALC, TRIG, CHOLHDL, LDLDIRECT in the last 72 hours. Thyroid Function Tests Recent Labs    06/12/17 0510  TSH 0.067*    Telemetry    Atrial fibrillation with variable ventricular response  ECG    A. fib with rapid ventricular response  Radiology    Dg Chest 1 View  Result Date: 06/10/2017 CLINICAL DATA:  Pneumonia, shortness of breath, cough EXAM: CHEST 1 VIEW COMPARISON:  06/09/2017 FINDINGS: Elevation of the right hemidiaphragm. Cardiomegaly. Diffuse interstitial prominence throughout the lungs appears worsened and may reflect interstitial edema. Bilateral lower lobe airspace opacities, slightly increased on the left since prior study, stable on the right. IMPRESSION:  Cardiomegaly with diffuse interstitial prominence, likely interstitial edema. Stable elevation of the right hemidiaphragm. Bibasilar opacities, worsening on the left since prior study concerning for pneumonia. Electronically Signed   By: Rolm Baptise M.D.   On: 06/10/2017 10:24   Dg Chest Port 1 View  Result Date: 06/11/2017 CLINICAL DATA:  Cough, persistent. eval for possible aspiration. EXAM: PORTABLE CHEST 1 VIEW COMPARISON:  06/10/2017 FINDINGS: There is persistent lung base opacity bilaterally, consistent with atelectasis, pneumonia or a combination. Mild cardiomegaly and bilateral interstitial thickening are noted similar to the previous day's study. No visualized pleural effusion. No pneumothorax. IMPRESSION: 1. No significant change from the previous day's study. Persistent lung base opacities that may reflect atelectasis, pneumonia or a combination of both. 2. Mild cardiomegaly and bilateral interstitial thickening. Mild congestive heart failure is suspected. Electronically Signed   By: Lajean Manes M.D.   On: 06/11/2017 18:51   Dg Chest Port 1 View  Result Date: 06/09/2017 CLINICAL DATA:  Pt having flu like symptoms for 7 days. Hx of cancer, CHF, diabetes, hypertension. Former smoker EXAM: PORTABLE CHEST 1 VIEW COMPARISON:  Chest x-rays dated 04/13/2016 and 08/24/2015 FINDINGS: Ill-defined opacities overlying the elevated right hemidiaphragm, highly suspicious for pneumonia. Diffuse bilateral interstitial prominence, similar to previous exam, suspected mild chronic versus recurrent interstitial edema. Heart size and mediastinal contours are stable. Atherosclerotic changes again noted at the aortic arch. No pneumothorax or large pleural effusion seen. No acute or suspicious osseous finding. IMPRESSION: 1. New ill-defined opacities at the right lung base, highly suspicious for pneumonia. 2. Bilateral interstitial prominence, presumably interstitial edema, similar to previous exams suggesting mild  chronic versus recurrent CHF/volume overload. 3. Aortic atherosclerosis. Electronically Signed   By: Franki Cabot M.D.   On: 06/09/2017 10:45    Assessment & Plan  Atrial fibrillation-still with fairly rapid ventricular response although improved somewhat with IV amiodarone.  Given lability of heart rate will continue with IV amiodarone with consideration for switching to p.o. if heart rate improves.  Currently on Xarelto for chronic anticoagulation.  Cardiomyopathy-EF 15 to 20%.  We will need to carefully diurese following renal function.  Aspiration pneumonia-on discussion with the patient and multiple family members.  Patient does have evidence of aspiration is on high flow oxygen.  He would like to drink water.  Patient and his family are aware that this may result in aspiration.  Given comorbid conditions age would recommend allowing water intake.  Patient and his family will speak with palliative care later today which I think is appropriate.  Signed, Javier Docker Damarco Keysor MD 06/13/2017, 11:03 AM  Pager: [phone]

## 2017-06-13 NOTE — Progress Notes (Signed)
[facility] [place] NAME: Ruben Gallegos    MR#:  [id number]  DATE OF BIRTH:  02-16-1921  SUBJECTIVE:  CHIEF COMPLAINT: Patient is into atrial fibrillation with RVR.  Heart rate is somewhat better on amiodarone drip, chest discomfort improved.  Several family members at bedside  REVIEW OF SYSTEMS:  CONSTITUTIONAL: No fever, fatigue or weakness.  EYES: No blurred or double vision.  EARS, NOSE, AND THROAT: No tinnitus or ear pain.  RESPIRATORY: Reporting improving of cough, shortness of breath, wheezing, denies hemoptysis.  CARDIOVASCULAR: No chest pain, orthopnea, edema.  GASTROINTESTINAL: No nausea, vomiting, diarrhea or abdominal pain.  GENITOURINARY: No dysuria, hematuria.  ENDOCRINE: No polyuria, nocturia,  HEMATOLOGY: No anemia, easy bruising or bleeding SKIN: No rash or lesion. MUSCULOSKELETAL: No joint pain or arthritis.   NEUROLOGIC: No tingling, numbness, weakness.  PSYCHIATRY: No anxiety or depression.   DRUG ALLERGIES:   Allergies  Allergen Reactions  . Altace [Ramipril] Shortness Of Breath  . Tramadol Shortness Of Breath  . Actos [Pioglitazone] Swelling  . Codeine Nausea Only  . Demerol [Meperidine] Nausea Only  . Lipitor [Atorvastatin] Other (See Comments)  . Metformin And Related Other (See Comments)  . Mevacor [Lovastatin] Other (See Comments)    VITALS:  Blood pressure 94/62, pulse (!) 123, temperature 97.9 F (36.6 C), temperature source Oral, resp. rate (!) 22, height 6' 1"  (1.854 m), weight 76.9 kg (169 lb 9.6 oz), SpO2 98 %.  PHYSICAL EXAMINATION:  GENERAL:  84 y.o.-year-old patient lying in the bed with no acute distress.  EYES: Pupils equal, round, reactive to light and accommodation. No scleral icterus. Extraocular muscles intact.  HEENT: Head atraumatic, normocephalic. Oropharynx and nasopharynx clear.  NECK:  Supple, no jugular venous distention. No thyroid enlargement, no tenderness.  LUNGS:  Moderate breath sounds bilaterally, no  wheezing, rales,rhonchi or crepitation.  no   use of accessory muscles of respiration.  CARDIOVASCULAR: Irregularly regular no murmurs, rubs, or gallops.  ABDOMEN: Soft, nontender, nondistended. Bowel sounds present.  EXTREMITIES: No pedal edema, cyanosis, or clubbing.  NEUROLOGIC: Cranial nerves are intact. Muscle strength generalized weakness in all extremities. Sensation intact. Gait not checked.  PSYCHIATRIC: The patient is alert and oriented x 3.  SKIN: No obvious rash, lesion, or ulcer.    LABORATORY PANEL:   CBC Recent Labs  Lab 06/12/17 0510  WBC 9.7  HGB 14.2  HCT 41.9  PLT 169   ------------------------------------------------------------------------------------------------------------------  Chemistries  Recent Labs  Lab 06/09/17 1020 06/11/17 0336  NA 135 137  K 3.8 3.6  CL 102 105  CO2 23 22  GLUCOSE 186* 350*  BUN 19 22*  CREATININE 1.09 0.94  CALCIUM 8.4* 8.3*  AST 29  --   ALT 18  --   ALKPHOS 60  --   BILITOT 1.1  --    ------------------------------------------------------------------------------------------------------------------  Cardiac Enzymes Recent Labs  Lab 06/11/17 0336  TROPONINI 0.10*   ------------------------------------------------------------------------------------------------------------------  RADIOLOGY:  Dg Chest Port 1 View  Result Date: 06/11/2017 CLINICAL DATA:  Cough, persistent. eval for possible aspiration. EXAM: PORTABLE CHEST 1 VIEW COMPARISON:  06/10/2017 FINDINGS: There is persistent lung base opacity bilaterally, consistent with atelectasis, pneumonia or a combination. Mild cardiomegaly and bilateral interstitial thickening are noted similar to the previous day's study. No visualized pleural effusion. No pneumothorax. IMPRESSION: 1. No significant change from the previous day's study. Persistent lung base opacities that may reflect atelectasis, pneumonia or a combination of  both. 2. Mild cardiomegaly and  bilateral interstitial thickening. Mild congestive heart failure is suspected. Electronically Signed   By: Lajean Manes M.D.   On: 06/11/2017 18:51    EKG:   Orders placed or performed during the hospital encounter of 06/09/17  . EKG 12-Lead  . EKG 12-Lead  . EKG  . EKG 12-Lead  . EKG 12-Lead  . EKG 12-Lead  . EKG 12-Lead  . EKG 12-Lead  . EKG 12-Lead    ASSESSMENT AND PLAN:  #Atrial fibrillation with RVR Patient is hypotensive too Patient is given digoxin IV and started on digoxin p.o. Amiodarone bolus and amiodarone drip, wean off as tolerated once heart rate is better and start p.o. amiodarone Cardiology consulted and discussed with Dr. Ubaldo Glassing appreciate his recommendations   #Acute hypoxic respiratory failure secondary to pneumonia with bronchoconstriction Weaned off Ventimask to high flow oxygen Continue IV antibiotics and Solu-Medrol for bronchoconstriction will be  taper as patient is clinically improving Bronchodilator treatment IV Lasix for fluid overload will be changed to p.o. Lasix spirometry  #Sepsis with right lower lobe pneumonia Patient met septic criteria with hypotension, tachycardia and tachypnea at the time of admission Patient is started on cefepime and vancomycin , clinically improved and change antibiotics to Augmentin  status post hydration with IV fluids Lactic acid is in the normal range   #elevated troponin-probably demand ischemia  patient seems to  has chronically elevated troponin when compared to previous labs We will cycle cardiac biomarkers and monitor patient closely Initial troponin  0.15 but patient is denying any chest pain  #Chronic systolic CHF previous ejection fraction was 15% Continue close monitoring of daily weights and intake and output Lasix 20  once daily Repeat echocardiogram-20-25% ejection fraction.  No pericardial effusion.  #Hypothyroidism continue Synthyroid   #Chronic history of  DVT-continue Xarelto his home medication  #Acute COPD-on steroids and bronchodilator treatments Albuterol changed to Xopenex in view of tachycardia  #Diabetes mellitus currently patient is hypoglycemic will hold off on the Lantus and Glucotrol and-provide sliding scale insulin  #Adult failure to thrive palliative care is consulted     All the records are reviewed and case discussed with Care Management/Social Workerr. Management plans discussed with the patient, son and daughter at bedside and they are in agreement.  CODE STATUS: DNR   TOTAL CRITICALCARE TIME TAKING CARE OF THIS PATIENT: 35  minutes.   POSSIBLE D/C IN1- 2  DAYS, DEPENDING ON CLINICAL CONDITION.  Note: This dictation was prepared with Dragon dictation along with smaller phrase technology. Any transcriptional errors that result from this process are unintentional.   [name] M.D on 06/13/2017 at 1:38 PM  Between 7am to 6pm - Pager - [phone] After 6pm go to [email or web address] - password [email or web address] [facility]  [place] [facility]  Office  [phone]  CC: Primary care physician; [name], MD

## 2017-06-13 NOTE — Evaluation (Signed)
Physical Therapy Evaluation Patient Details Name: Ruben Gallegos MRN: [id number] DOB: 05/13/1920 Today's Date: 06/13/2017   History of Present Illness  Pt is a 84 y.o. male with a known history of cancer, diabetes mellitus, hypertension, hypothyroidism, hyperlipidemia, chronic congestive heart failure is presenting to the ED with a chief complaint of shortness of breath, cough and congestion. Denies any chest pain. Patient was found to be hypoxic with 87% pulse ox on room air and 94% on 2 L, EKG with sinus tachycardia at 100 but lactic acid is normal. Chest x-ray has revealed right lower lobe pneumonia. Patient is started on cefepime and vancomycin and hospitalist team is called to admit the patient.  Assessment includes: A-fib with RVR, cardiomyopathy with EF 15-20%, acute hypoxic respiratory failure and sepsis secondary to pneumonia, elevated troponin likely due to demand ischemia, and acute COPD exacerbation.     Clinical Impression  Pt started on amioderone drip 06/12/17 with HR remaining elevated prior to PT eval.  Spoke to cardiologist Dr. Ubaldo Glassing who stated patient ok to participate with PT services to patient's tolerance.  Upon entering pt's room pt's HR had come down to 89 bpm from recent chart entry of 123 bpm with SpO2 at 92% on HFNC.  Pt presents with deficits in strength, transfers, mobility, gait, balance, and activity tolerance.  Pt required Min A with bed mobility tasks and sit to/from stand transfers along with cues for sequencing.  Pt able to perform minimal amb at EOB before he fatigued and needed to sit to rest; SpO2 92% and HR 105 bpm after amb compared to 92% and 89 bpm at baseline.  Pt reported no adverse symptoms other than general fatigue.  The pt's HR returned to low 90's after 1-2 min in supine.  Pt will benefit from PT services in a SNF setting upon discharge to safely address above deficits for decreased caregiver assistance and eventual return to PLOF.      Follow Up  Recommendations      Equipment Recommendations  None recommended by PT    Recommendations for Other Services       Precautions / Restrictions Precautions Precautions: Fall Restrictions Weight Bearing Restrictions: No      Mobility  Bed Mobility Overal bed mobility: Needs Assistance Bed Mobility: Supine to Sit;Sit to Supine;Rolling Rolling: Min assist   Supine to sit: Min assist Sit to supine: Supervision   General bed mobility comments: Min A and mod verbal cues for sequencing with bed mobility  Transfers Overall transfer level: Needs assistance Equipment used: Rolling walker (2 wheeled) Transfers: Sit to/from Stand Sit to Stand: Min assist         General transfer comment: Mod verbal cues for sequencing during transfers  Ambulation/Gait Ambulation/Gait assistance: Min guard Ambulation Distance (Feet): 5 Feet Assistive device: Rolling walker (2 wheeled) Gait Pattern/deviations: Step-to pattern;Trunk flexed;Wide base of support;Shuffle   Gait velocity interpretation: Below normal speed for age/gender General Gait Details: Minimal amb at EOB before pt fatigued and needed to sit to rest; SpO2 92% and HR 105 bpm after amb compared to 92% and 89 bpm at baseline  Stairs            Wheelchair Mobility    Modified Rankin (Stroke Patients Only)       Balance Overall balance assessment: Needs assistance Sitting-balance support: Bilateral upper extremity supported;Feet supported Sitting balance-Leahy Scale: Good     Standing balance support: Bilateral upper extremity supported Standing balance-Leahy Scale: Fair  Pertinent Vitals/Pain Pain Assessment: No/denies pain    Home Living Family/patient expects to be discharged to:: Private residence Living Arrangements: Alone Available Help at Discharge: Personal care attendant;Available PRN/intermittently Type of Home: Independent living facility Home Access: Level  entry     Home Layout: One level Home Equipment: Walker - 2 wheels;Walker - 4 wheels;Wheelchair - power Additional Comments: Pt has a PCA daily from 8-10 am and then from 5-9 pm     Prior Function Level of Independence: Needs assistance   Gait / Transfers Assistance Needed: Pt Mod Ind with amb in his apt with a rollator and uses an electric w/c in the facility, no fall history  ADL's / Homemaking Assistance Needed: Pt requires assistance from his PCA for dressing and meals but is Ind with bathing        Hand Dominance   Dominant Hand: Right    Extremity/Trunk Assessment   Upper Extremity Assessment Upper Extremity Assessment: Generalized weakness    Lower Extremity Assessment Lower Extremity Assessment: Generalized weakness       Communication   Communication: No difficulties  Cognition Arousal/Alertness: Awake/alert Behavior During Therapy: WFL for tasks assessed/performed Overall Cognitive Status: Within Functional Limits for tasks assessed                                        General Comments      Exercises Total Joint Exercises Ankle Circles/Pumps: AROM;Both;5 reps;10 reps Quad Sets: Strengthening;Both;5 reps;10 reps Gluteal Sets: Strengthening;Both;5 reps;10 reps Hip ABduction/ADduction: AAROM;Both;10 reps Straight Leg Raises: AAROM;Both;10 reps Long Arc Quad: AROM;Both;10 reps Knee Flexion: AROM;Both;10 reps Marching in Standing: AROM;Both;5 reps   Assessment/Plan    PT Assessment Patient needs continued PT services  PT Problem List Decreased strength;Decreased activity tolerance;Decreased balance;Decreased mobility       PT Treatment Interventions DME instruction;Gait training;Functional mobility training;Balance training;Therapeutic exercise;Therapeutic activities;Patient/family education    PT Goals (Current goals can be found in the Care Plan section)  Acute Rehab PT Goals Patient Stated Goal: To walk better PT Goal  Formulation: With patient Time For Goal Achievement: 06/26/17 Potential to Achieve Goals: Good    Frequency Min 2X/week   Barriers to discharge Decreased caregiver support      Co-evaluation               AM-PAC PT "6 Clicks" Daily Activity  Outcome Measure Difficulty turning over in bed (including adjusting bedclothes, sheets and blankets)?: Unable Difficulty moving from lying on back to sitting on the side of the bed? : Unable Difficulty sitting down on and standing up from a chair with arms (e.g., wheelchair, bedside commode, etc,.)?: Unable Help needed moving to and from a bed to chair (including a wheelchair)?: A Little Help needed walking in hospital room?: A Lot Help needed climbing 3-5 steps with a railing? : Total 6 Click Score: 9    End of Session Equipment Utilized During Treatment: Gait belt;Oxygen Activity Tolerance: Patient limited by fatigue Patient left: in bed;with call bell/phone within reach;with bed alarm set Nurse Communication: Mobility status PT Visit Diagnosis: Muscle weakness (generalized) (M62.81);Difficulty in walking, not elsewhere classified (R26.2)    Time: 6010-9323 PT Time Calculation (min) (ACUTE ONLY): 33 min   Charges:   PT Evaluation $PT Eval Low Complexity: 1 Low PT Treatments $Therapeutic Exercise: 8-22 mins   PT G Codes:        D. Royetta Asal PT, DPT  06/13/17, 1:56 PM

## 2017-06-13 NOTE — Consult Note (Signed)
Consultation Note Date: 06/13/2017   Patient Name: Ruben Gallegos  DOB: 12-Oct-1920  MRN: [id number]  Age / Sex: 84 y.o., male  PCP: [name], MD Referring Physician: [name], MD  Reason for Consultation: Establishing goals of care and Psychosocial/spiritual support  HPI/Patient Profile: 84 y.o. male with past medical history of CHF (EF 15-20%), DM, hypothyroidism who was admitted on 06/09/2017 with shortness of breath.  He was found to have RLL pneumonia.  He has also developed Afib with RVR that is difficult to control.  The patient has been on and off of venti mask and high flow n/c.   Clinical Assessment and Goals of Care:  I have reviewed medical records including EPIC notes, labs and imaging, received report from Dr. Margaretmary Eddy, assessed the patient and then met at the bedside along with his two sons and two daughter's in law to discuss diagnosis prognosis, [place], EOL wishes, disposition and options. He has 2 physical therapists, a nurse, and a CRNA in his family   I introduced Palliative Medicine as specialized medical care for people living with serious illness. It focuses on providing relief from the symptoms and stress of a serious illness. The goal is to improve quality of life for both the patient and the family.  We discussed a brief life review of the patient. He started his career with [employer], but then went to work with [employer] of his career.  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  He retired and currently lives in [place].  He has additional help that comes in to assist him twice a day.  He is a spiritual man, Presbyterian.  As far as functional and nutritional status he was able to get around on a rolling walker - but now has not been out of bed for the last three days.  He is on high flow nasal canula and develops  respiratory distress when he is changed to nasal canula   We discussed his current illness and what it means in the larger context of his on-going co-morbidities.  Natural disease trajectory and expectations at EOL were discussed.  Specifically we discussed his heart failure as aspiration pneumonia. He is in a new place now.  He has had heart failure for years - but now he is having aspiration pneumonia and atrial fibrillation with RVR.  Both of these things are new.  We discussed the need to get off high flow nasal canula and IV amiodarone in order to leave the hospital.  We also talked about the speedy deconditioning that comes when a 34+ yo is in bed for days at a time.  The patient feels he will need a higher lever of care back at [facility].    I attempted to elicit values and goals of care important to the patient.  Per his son - they have made two commitments to Mr. Cletus Gash. (1) always talk openly and bluntly about his health in front of him.  (  2) Do not let him lay in bed and have a prolonged dying process.  He has actually been on Hospice services before.  Approximately 5 years ago after hernia surgery the doctors did not feel he would make it and he went home with hospice.  Eventually he graduated off.  We talked about aspiration - and the risk of choking or aspiration pneumonia.  The patient and family accept these risks and do not want thickened liquids.  We talked about using morphine to prevent panic when he is being weaned down from high flow.  Reportedly Mr. Dosher maintains good oxygen sats - he just panics.  The panic is why venti mask and high flow are being used.  Hospice and Palliative Care services outpatient were explained and offered.  Questions and concerns were addressed.The family was encouraged to call with questions or concerns.   PMT will re-assess on 3/13.      Primary Decision Maker:  PATIENT and Son.    SUMMARY OF RECOMMENDATIONS    PRN morphine for air  hunger/panic when weaning high flow.  Maintain oxygen sats of 92% Allow thin liquids - family accepts the risk of aspiration. PMT will follow with you.  Code Status/Advance Care Planning:  DNR   Symptom Management:   Morphine PRN shortness of breath or air hunger   Psycho-social/Spiritual:   Desire for further Chaplaincy support: yes   Prognosis: If he is able to get off high flow and IV amio he may go to [place] facility and live weeks to months.  If he is unable to be weaned from high flow he may require IV morphine and be best cared for at hospice house or in the Palliative unit of the hospital.    Discharge Planning: To Be Determined      Primary Diagnoses: Present on Admission: . PNA (pneumonia)   I have reviewed the medical record, interviewed the patient and family, and examined the patient. The following aspects are pertinent.  Past Medical History:  Diagnosis Date  . Cancer ([place])   . CHF (congestive heart failure) ([place])   . Diabetes mellitus without complication ([place])   . GERD (gastroesophageal reflux disease)   . HLD (hyperlipidemia)   . Hypertension   . Hypothyroidism    Social History   Socioeconomic History  . Marital status: Widowed    Spouse name: None  . Number of children: None  . Years of education: None  . Highest education level: None  Social Needs  . Financial resource strain: None  . Food insecurity - worry: None  . Food insecurity - inability: None  . Transportation needs - medical: None  . Transportation needs - non-medical: None  Occupational History  . Occupation: retired  Tobacco Use  . Smoking status: Former Research scientist (life sciences)  . Smokeless tobacco: Never Used  Substance and Sexual Activity  . Alcohol use: Yes    Alcohol/week: 0.0 oz  . Drug use: No  . Sexual activity: None  Other Topics Concern  . None  Social History Narrative  . None   Family History  Problem Relation Age of Onset  . CAD Unknown   . Diabetes  Unknown   . Diabetes Mother   . CVA Father    Scheduled Meds: . amoxicillin-clavulanate  1 tablet Oral Q12H  . cholecalciferol  1,000 Units Oral Daily  . digoxin  0.125 mg Oral Daily  . insulin aspart  0-15 Units Subcutaneous TID WC  . insulin aspart  0-5 Units  Subcutaneous QHS  . insulin aspart  7 Units Subcutaneous TID WC  . insulin glargine  20 Units Subcutaneous Daily  . ipratropium  0.5 mg Nebulization Q6H  . levalbuterol  0.63 mg Nebulization Q6H  . levothyroxine  100 mcg Oral QAC breakfast  . mouth rinse  15 mL Mouth Rinse BID  . [START ON 06/14/2017] methylPREDNISolone (SOLU-MEDROL) injection  40 mg Intravenous Daily  . mirtazapine  7.5 mg Oral Daily  . pantoprazole  40 mg Oral Daily  . polyethylene glycol  17 g Oral Daily  . rivaroxaban  20 mg Oral Daily  . sodium chloride flush  3 mL Intravenous Q12H   Continuous Infusions: . amiodarone 30 mg/hr (06/13/17 0906)   PRN Meds:.acetaminophen, ALPRAZolam, guaiFENesin-dextromethorphan, levalbuterol, metoprolol tartrate, morphine injection, mupirocin ointment, nitroGLYCERIN, oxymetazoline, sodium chloride Allergies  Allergen Reactions  . Altace [Ramipril] Shortness Of Breath  . Tramadol Shortness Of Breath  . Actos [Pioglitazone] Swelling  . Codeine Nausea Only  . Demerol [Meperidine] Nausea Only  . Lipitor [Atorvastatin] Other (See Comments)  . Metformin And Related Other (See Comments)  . Mevacor [Lovastatin] Other (See Comments)   Review of Systems patient complains of constipation, improved with miralax, SOB.  Denies significant pain. Physical Exam  Elderly gentleman awake, alert, coherent with clear speech CV rrr resp expiratory wheeze particularly in the LLbase.  On high flow N/C  Vital Signs: BP 94/62 (BP Location: Right Arm)   Pulse 89   Temp 97.9 F (36.6 C) (Oral)   Resp (!) 22   Ht 6' 1"  (1.854 m)   Wt 76.9 kg (169 lb 9.6 oz)   SpO2 92%   BMI 22.38 kg/m  Pain Assessment: No/denies pain   Pain Score:  0-No pain   SpO2: SpO2: 92 % O2 Device:SpO2: 92 % O2 Flow Rate: .O2 Flow Rate (L/min): 35 L/min  IO: Intake/output summary:   Intake/Output Summary (Last 24 hours) at 06/13/2017 1401 Last data filed at 06/13/2017 0800 Gross per 24 hour  Intake 843.96 ml  Output 330 ml  Net 513.96 ml    LBM: Last BM Date: 06/10/17 Baseline Weight: Weight: 81.6 kg (180 lb) Most recent weight: Weight: 76.9 kg (169 lb 9.6 oz)     Palliative Assessment/Data: 30%     Time In: 1:00 Time Out: 2:10 Time Total: 70 min. Greater than 50%  of this time was spent counseling and coordinating care related to the above assessment and plan.  Signed by: Florentina Jenny, PA-C Palliative Medicine Pager: [phone]  Please contact Palliative Medicine Team phone at [phone] for questions and concerns.  For individual provider: See Shea Evans

## 2017-06-14 ENCOUNTER — Encounter: Payer: Self-pay | Admitting: Radiology

## 2017-06-14 ENCOUNTER — Inpatient Hospital Stay: Payer: Medicare Other

## 2017-06-14 DIAGNOSIS — I480 Paroxysmal atrial fibrillation: Secondary | ICD-10-CM

## 2017-06-14 DIAGNOSIS — R053 Chronic cough: Secondary | ICD-10-CM

## 2017-06-14 DIAGNOSIS — R05 Cough: Secondary | ICD-10-CM

## 2017-06-14 LAB — GLUCOSE, CAPILLARY
GLUCOSE-CAPILLARY: 109 mg/dL — AB (ref 65–99)
Glucose-Capillary: 127 mg/dL — ABNORMAL HIGH (ref 65–99)
Glucose-Capillary: 54 mg/dL — ABNORMAL LOW (ref 65–99)
Glucose-Capillary: 56 mg/dL — ABNORMAL LOW (ref 65–99)
Glucose-Capillary: 111 mg/dL — ABNORMAL HIGH (ref 65–99)
Glucose-Capillary: 138 mg/dL — ABNORMAL HIGH (ref 65–99)

## 2017-06-14 LAB — CULTURE, BLOOD (ROUTINE X 2)
CULTURE: NO GROWTH
Culture: NO GROWTH
SPECIAL REQUESTS: ADEQUATE

## 2017-06-14 LAB — BASIC METABOLIC PANEL
Anion gap: 9 (ref 5–15)
BUN: 27 mg/dL — ABNORMAL HIGH (ref 6–20)
CALCIUM: 8.3 mg/dL — AB (ref 8.9–10.3)
CHLORIDE: 103 mmol/L (ref 101–111)
CO2: 23 mmol/L (ref 22–32)
CREATININE: 0.85 mg/dL (ref 0.61–1.24)
GFR calc Af Amer: >60 mL/min (ref >60)
GFR calc non Af Amer: >60 mL/min (ref >60)
GLUCOSE: 168 mg/dL — AB (ref 65–99)
Potassium: 4.4 mmol/L (ref 3.5–5.1)
Sodium: 135 mmol/L (ref 135–145)

## 2017-06-14 LAB — PROCALCITONIN: Procalcitonin: <0.1 ng/mL

## 2017-06-14 MED ORDER — MIDODRINE HCL 5 MG PO TABS
10.0000 mg | ORAL_TABLET | Freq: Three times a day (TID) | ORAL | Status: DC
  Administered 2017-06-14 – 2017-06-15 (×3): 10 mg via ORAL
  Filled 2017-06-14 (×3): qty 2

## 2017-06-14 MED ORDER — SENNA 8.6 MG PO TABS
1.0000 | ORAL_TABLET | Freq: Every day | ORAL | Status: DC
  Administered 2017-06-14: 8.6 mg via ORAL
  Filled 2017-06-14: qty 1

## 2017-06-14 MED ORDER — FUROSEMIDE 10 MG/ML IJ SOLN
20.0000 mg | Freq: Two times a day (BID) | INTRAMUSCULAR | Status: DC
  Administered 2017-06-14 – 2017-06-15 (×2): 20 mg via INTRAVENOUS
  Filled 2017-06-14 (×2): qty 2

## 2017-06-14 MED ORDER — INSULIN GLARGINE 100 UNIT/ML ~~LOC~~ SOLN
15.0000 [IU] | Freq: Every day | SUBCUTANEOUS | Status: DC
  Administered 2017-06-15: 15 [IU] via SUBCUTANEOUS
  Filled 2017-06-14 (×2): qty 0.15

## 2017-06-14 MED ORDER — IOPAMIDOL (ISOVUE-370) INJECTION 76%
75.0000 mL | Freq: Once | INTRAVENOUS | Status: AC | PRN
  Administered 2017-06-14: 75 mL via INTRAVENOUS

## 2017-06-14 MED ORDER — MORPHINE SULFATE (CONCENTRATE) 10 MG/0.5ML PO SOLN
5.0000 mg | ORAL | Status: DC | PRN
  Administered 2017-06-14 (×2): 5 mg via ORAL
  Filled 2017-06-14 (×2): qty 1

## 2017-06-14 MED ORDER — INSULIN ASPART 100 UNIT/ML ~~LOC~~ SOLN
5.0000 [IU] | Freq: Three times a day (TID) | SUBCUTANEOUS | Status: DC
  Administered 2017-06-15 (×2): 5 [IU] via SUBCUTANEOUS
  Filled 2017-06-14 (×2): qty 1

## 2017-06-14 MED ORDER — MORPHINE SULFATE (PF) 2 MG/ML IV SOLN: 1.0000 mg | INTRAVENOUS | Status: DC | PRN

## 2017-06-14 MED ORDER — AMIODARONE HCL 200 MG PO TABS
400.0000 mg | ORAL_TABLET | Freq: Two times a day (BID) | ORAL | Status: DC
  Administered 2017-06-14 – 2017-06-15 (×3): 400 mg via ORAL
  Filled 2017-06-14 (×3): qty 2

## 2017-06-14 MED ORDER — GUAIFENESIN 100 MG/5ML PO SOLN
10.0000 mL | Freq: Three times a day (TID) | ORAL | Status: DC
  Administered 2017-06-14 – 2017-06-15 (×2): 200 mg via ORAL
  Filled 2017-06-14 (×5): qty 10

## 2017-06-14 NOTE — Progress Notes (Signed)
[place] [facility] [place] NAME: Ruben Gallegos    MR#:  [id number]  DATE OF BIRTH:  Dec 22, 1920  SUBJECTIVE:  Patient's oxygen requirement is less seen on 4 L but continues to have cough and complaint of shortness of breath  REVIEW OF SYSTEMS:  CONSTITUTIONAL: No fever, fatigue or weakness.  EYES: No blurred or double vision.  EARS, NOSE, AND THROAT: No tinnitus or ear pain.  RESPIRATORY: Positive cough, positive shortness of breath, wheezing, denies hemoptysis.  CARDIOVASCULAR: No chest pain, orthopnea, edema.  GASTROINTESTINAL: No nausea, vomiting, diarrhea or abdominal pain.  GENITOURINARY: No dysuria, hematuria.  ENDOCRINE: No polyuria, nocturia,  HEMATOLOGY: No anemia, easy bruising or bleeding SKIN: No rash or lesion. MUSCULOSKELETAL: No joint pain or arthritis.   NEUROLOGIC: No tingling, numbness, weakness.  PSYCHIATRY: No anxiety or depression.   DRUG ALLERGIES:   Allergies  Allergen Reactions  . Altace [Ramipril] Shortness Of Breath  . Tramadol Shortness Of Breath  . Actos [Pioglitazone] Swelling  . Codeine Nausea Only  . Demerol [Meperidine] Nausea Only  . Lipitor [Atorvastatin] Other (See Comments)  . Metformin And Related Other (See Comments)  . Mevacor [Lovastatin] Other (See Comments)    VITALS:  Blood pressure 97/63, pulse 81, temperature (!) 97.5 F (36.4 C), resp. rate 18, height 6' 1"  (1.854 m), weight 169 lb 9.6 oz (76.9 kg), SpO2 93 %.  PHYSICAL EXAMINATION:  GENERAL:  84 y.o.-year-old patient lying in the bed with no acute distress.  EYES: Pupils equal, round, reactive to light and accommodation. No scleral icterus. Extraocular muscles intact.  HEENT: Head atraumatic, normocephalic. Oropharynx and nasopharynx clear.  NECK:  Supple, no jugular venous distention. No thyroid enlargement, no tenderness.  LUNGS: Decreased breath sounds at the bases no accessory muscle usage CARDIOVASCULAR: Irregularly regular  no murmurs, rubs, or gallops.  ABDOMEN: Soft, nontender, nondistended. Bowel sounds present.  EXTREMITIES: No pedal edema, cyanosis, or clubbing.  NEUROLOGIC: Cranial nerves are intact. Muscle strength generalized weakness in all extremities. Sensation intact. Gait not checked.  PSYCHIATRIC: The patient is alert and oriented x 3.  SKIN: No obvious rash, lesion, or ulcer.    LABORATORY PANEL:   CBC Recent Labs  Lab 06/12/17 0510  WBC 9.7  HGB 14.2  HCT 41.9  PLT 169   ------------------------------------------------------------------------------------------------------------------  Chemistries  Recent Labs  Lab 06/09/17 1020  06/14/17 1041  NA 135   < > 135  K 3.8   < > 4.4  CL 102   < > 103  CO2 23   < > 23  GLUCOSE 186*   < > 168*  BUN 19   < > 27*  CREATININE 1.09   < > 0.85  CALCIUM 8.4*   < > 8.3*  AST 29  --   --   ALT 18  --   --   ALKPHOS 60  --   --   BILITOT 1.1  --   --    < > = values in this interval not displayed.   ------------------------------------------------------------------------------------------------------------------  Cardiac Enzymes Recent Labs  Lab 06/11/17 0336  TROPONINI 0.10*   ------------------------------------------------------------------------------------------------------------------  RADIOLOGY:  Ct Angio Chest Pe W Or Wo Contrast  Result Date: 06/14/2017 CLINICAL DATA:  Shortness of Breath EXAM: CT ANGIOGRAPHY CHEST WITH CONTRAST TECHNIQUE: Multidetector CT imaging of the chest was performed using the standard protocol during bolus administration of intravenous contrast. Multiplanar CT image reconstructions and MIPs were obtained to evaluate the vascular anatomy. CONTRAST:  45m ISOVUE-370 IOPAMIDOL (ISOVUE-370) INJECTION 76% COMPARISON:  08/24/2015 FINDINGS: Cardiovascular: No filling defects in the pulmonary arteries to suggest pulmonary emboli. Cardiomegaly. Diffuse coronary artery calcifications. Moderate aortic  calcifications. No evidence of aortic aneurysm. Mediastinum/Nodes: Mildly prominent mediastinal lymph nodes. Pretracheal lymph node has a short axis diameter of 11 mm. Subcarinal lymph node has a short axis diameter of 15 mm. AP window lymph node has a short axis diameter of 12 mm. No hilar or axillary adenopathy. Lungs/Pleura: Small right pleural effusion. Patchy airspace disease throughout the lungs bilaterally, right greater than left, favor multifocal pneumonia. Calcified pleural plaques noted bilaterally within the upper hemithoraces. Upper Abdomen: Elevation of the right hemidiaphragm. No acute findings. Musculoskeletal: Chest wall soft tissues are unremarkable. No acute bony abnormality. Review of the MIP images confirms the above findings. IMPRESSION: No evidence of pulmonary embolus. Cardiomegaly, severe/diffuse coronary artery disease. Patchy bilateral ground-glass airspace opacities, right greater than left concerning for multifocal pneumonia. Elevation of the right hemidiaphragm with right base atelectasis. Mildly enlarged mediastinal lymph nodes, stable since 2017. Aortic Atherosclerosis (ICD10-I70.0). Electronically Signed   By: KRolm BaptiseM.D.   On: 06/14/2017 13:47    EKG:   Orders placed or performed during the hospital encounter of 06/09/17  . EKG 12-Lead  . EKG 12-Lead  . EKG  . EKG 12-Lead  . EKG 12-Lead  . EKG 12-Lead  . EKG 12-Lead  . EKG 12-Lead  . EKG 12-Lead    ASSESSMENT AND PLAN:  #Atrial fibrillation with RVR Continue amiodarone heart rate improved   #Acute hypoxic respiratory failure secondary to pneumonia with bronchoconstriction CT scan of the chest shows  Bilateral infiltrates consistent with pneumonia howeve his pro calcitonin level is normal  , so this finding may be related to chronic interstitial lung disease or chronic aspiration  #Sepsis with right lower lobe pneumonia Patient met septic criteria with hypotension, tachycardia and tachypnea at the time  of admission Continue Augmentin for now Lactic acid is in the normal range   #elevated troponin-probably demand ischemia No further workup needed for this#Chronic systolic CHF previous ejection fraction was 15% Continue close monitoring of daily weights and intake and output Repeat echocardiogram-20-25% ejection fraction.  No pericardial effusion.  #Hypothyroidism continue Synthyroid  Hold Lasix because of the hypotension #Chronic history of DVT-continue Xarelto his home medication  #Acute COPD-on steroids and bronchodilator treatments Albuterol changed to Xopenex in view of tachycardia  #Diabetes mellitus currently patient is hypoglycemic will hold off on the Lantus and Glucotrol and-provide sliding scale insulin  #Adult failure to thrive palliative care is consulted     All the records are reviewed and case discussed with Care Management/Social Workerr. Management plans discussed with the patient, son and daughter at bedside and they are in agreement.  CODE STATUS: DNR   TOTAL CRITICALCARE TIME TAKING CARE OF THIS PATIENT: 35  minutes.   POSSIBLE D/C IN1- 2  DAYS, DEPENDING ON CLINICAL CONDITION.  Note: This dictation was prepared with Dragon dictation along with smaller phrase technology. Any transcriptional errors that result from this process are unintentional.   SDustin FlockM.D on 06/14/2017 at 3:34 PM  Between 7am to 6pm - Pager - [phone] 6pm go to [email or web address] - password [email or web address] [facility]ospital[facility]  Office  [phone] CC: Primary care physician; KAdin Hector MD

## 2017-06-14 NOTE — Care Management Important Message (Signed)
Important Message  Patient Details  Name: Ruben Gallegos MRN: [id number] Date of Birth: 02/12/1921   Medicare Important Message Given:  Yes  Signed IM notice given   Katrina Stack, RN 06/14/2017, 9:24 AM

## 2017-06-14 NOTE — Progress Notes (Signed)
Inpatient Diabetes Program Recommendations  AACE/ADA: New Consensus Statement on Inpatient Glycemic Control (2015)  Target Ranges:  Prepandial:   less than 140 mg/dL      Peak postprandial:   less than 180 mg/dL (1-2 hours)      Critically ill patients:  140 - 180 mg/dL   Results for JVION, TURGEON (MRN [id number]) as of 06/14/2017 08:42  Ref. Range 06/13/2017 07:53 06/13/2017 11:34 06/13/2017 16:21 06/13/2017 20:51 06/14/2017 07:52  Glucose-Capillary Latest Ref Range: 65 - 99 mg/dL 277 (H) 307 (H) 155 (H) 86 109 (H)    Review of Glycemic Control  HomeDM Meds: Lantus 12unitsQAM, Lantus26 unitsQPM,Glipizide 10 mg daily CurrentOrders:Novolog 0-15units TID, Novolog 0-5 units QHS, Lantus 20 units daily, Novolog 7 units TID with meals for meal coverage; Solumedrol 40 mg daily  Inpatient Diabetes Program Recommendations: Insulin-Basal:Noted steroids decreased and fasting glucose 109 mg/dl this morning. Please consider decreasingLantusto 15units QAM. Insulin-Meal Coverage: Please consider decreasing meal coverage to Novolog 5 units TID with meals if patient is eating at least 50% of meals  Thanks, Barnie Alderman, RN, MSN, CDE Diabetes Coordinator Inpatient Diabetes Program [phone] (Team Pager from 8am to 5pm)

## 2017-06-14 NOTE — Care Management (Signed)
Physical therapy evaluated 3/12 and recommended skilled nursing facility.  Palliative is consulting.  Patient is DNR.  Patient off HFNC

## 2017-06-14 NOTE — Progress Notes (Addendum)
Daily Progress Note   Patient Name: Ruben Gallegos       Date: 06/14/2017 DOB: 1921-01-11  Age: 84 y.o. MRN#: [id number] Attending Physician: Dustin Flock, MD Primary Care Physician: [name], MD Admit Date: 06/09/2017  Reason for Consultation/Follow-up: Establishing goals of care  Subjective: Patient states he slept well but has no appetite.  He's had no bowel movement.  He wants to improve quickly and do more.  States "I'm not doing much, only what I'm told to do."   Assessment: 84 yo who was on no oxygen and ambulating with a RW prior to admission - is now off high flow and on 4L N/C comfortably.  He has a hacking cough.  Progressing from IV amio to pill form today.     Patient Profile/HPI:  84 y.o. male with past medical history of CHF (EF 15-20%), DM, hypothyroidism who was admitted on 06/09/2017 with shortness of breath.  He was found to have RLL pneumonia.  He has also developed Afib with RVR that is difficult to control.  The patient has been on and off of venti mask and high flow n/c.   Length of Stay: 5  Current Medications: Scheduled Meds:  . amiodarone  400 mg Oral BID  . amoxicillin-clavulanate  1 tablet Oral Q12H  . cholecalciferol  1,000 Units Oral Daily  . digoxin  0.125 mg Oral Daily  . guaiFENesin  10 mL Oral Q8H  . insulin aspart  0-15 Units Subcutaneous TID WC  . insulin aspart  0-5 Units Subcutaneous QHS  . insulin aspart  7 Units Subcutaneous TID WC  . insulin glargine  20 Units Subcutaneous Daily  . ipratropium  0.5 mg Nebulization Q6H  . levalbuterol  0.63 mg Nebulization Q6H  . levothyroxine  100 mcg Oral QAC breakfast  . mouth rinse  15 mL Mouth Rinse BID  . methylPREDNISolone (SOLU-MEDROL) injection  40 mg Intravenous Daily  . mirtazapine  7.5  mg Oral Daily  . pantoprazole  40 mg Oral Daily  . polyethylene glycol  17 g Oral Daily  . rivaroxaban  20 mg Oral Daily  . sodium chloride flush  3 mL Intravenous Q12H    Continuous Infusions:   PRN Meds: acetaminophen, ALPRAZolam, guaiFENesin-dextromethorphan, levalbuterol, metoprolol tartrate, morphine injection, morphine CONCENTRATE, mupirocin ointment, nitroGLYCERIN, oxymetazoline, sodium  chloride  Physical Exam       Very pleasant elderly man, awake, alert, coherent, sitting up in a chair with a hacking cough. CV irreg irreg resp coarse, non-productive cough Abdomen firm, nt, nd Ext no edema.  Vital Signs: BP 97/63 (BP Location: Left Arm)   Pulse 81   Temp (!) 97.5 F (36.4 C)   Resp 18   Ht 6\' 1"  (1.854 m)   Wt 76.9 kg (169 lb 9.6 oz)   SpO2 96%   BMI 22.38 kg/m  SpO2: SpO2: 96 % O2 Device: O2 Device: Nasal Cannula O2 Flow Rate: O2 Flow Rate (L/min): 4 L/min  Intake/output summary:   Intake/Output Summary (Last 24 hours) at 06/14/2017 1139 Last data filed at 06/14/2017 1126 Gross per 24 hour  Intake 1039 ml  Output 1500 ml  Net -461 ml   LBM: Last BM Date: 06/13/17 Baseline Weight: Weight: 81.6 kg (180 lb) Most recent weight: Weight: 76.9 kg (169 lb 9.6 oz)       Palliative Assessment/Data:      Patient Active Problem List   Diagnosis Date Noted  . Hypoxia   . Sepsis ([place])   . Acute on chronic systolic heart failure ([place])   . Palliative care encounter   . PNA (pneumonia) 06/09/2017  . Chest pain 04/13/2016  . HLD (hyperlipidemia) 10/15/2015  . Acne erythematosa 10/15/2015  . Diabetes ([place]) 08/25/2015  . HTN (hypertension) 08/25/2015  . Hypothyroidism 08/25/2015  . Acute on chronic systolic CHF (congestive heart failure) ([place]) 08/25/2015  . GERD (gastroesophageal reflux disease) 08/25/2015  . Arteriosclerosis of coronary artery 08/10/2015  . Beat, premature ventricular 08/10/2015  . Paroxysmal supraventricular tachycardia ([place]) 05/21/2015  .  H/O deep venous thrombosis 03/23/2015  . Healed or old pulmonary embolism 03/23/2015  . Edema leg 03/23/2015    Palliative Care Plan    Recommendations/Plan:  Added SL morphine for mild to moderate SOB/air hunger.  It is less likely to drop his BP.  Will add guaifenesin for cough   Add senna qhs for constipation  I anticipate he would be best served by returning to [place] facility with hospice vs PT/OT and then Hospice.  I'm uncertain of what he will be able to do.   Goals of Care and Additional Recommendations:  Limitations on Scope of Treatment: Full Scope Treatment  Code Status:  DNR  Prognosis:   < 6 months given advanced heart failure now with afib, aspiration pneumonia, deconditioning and advanced age.   Discharge Planning:  To Be Determined  Care plan was discussed with son  Thank you for allowing the Palliative Medicine Team to assist in the care of this patient.  Total time spent:  25 min.     Greater than 50%  of this time was spent counseling and coordinating care related to the above assessment and plan.  Florentina Jenny, PA-C Palliative Medicine  Please contact Palliative MedicineTeam phone at [phone] for questions and concerns between 7 am - 7 pm.   Please see AMION for individual provider pager numbers.

## 2017-06-14 NOTE — Progress Notes (Signed)
Patient Name: Ruben Gallegos Date of Encounter: 06/14/2017  Hospital Problem List     Active Problems:   PNA (pneumonia)   Hypoxia   Sepsis ([place])   Acute on chronic systolic heart failure [facility])   Palliative care encounter    Patient Profile     85 yo male with sob  Subjective   Still weak and sob  Inpatient Medications    . amiodarone  400 mg Oral BID  . amoxicillin-clavulanate  1 tablet Oral Q12H  . cholecalciferol  1,000 Units Oral Daily  . digoxin  0.125 mg Oral Daily  . insulin aspart  0-15 Units Subcutaneous TID WC  . insulin aspart  0-5 Units Subcutaneous QHS  . insulin aspart  7 Units Subcutaneous TID WC  . insulin glargine  20 Units Subcutaneous Daily  . ipratropium  0.5 mg Nebulization Q6H  . levalbuterol  0.63 mg Nebulization Q6H  . levothyroxine  100 mcg Oral QAC breakfast  . mouth rinse  15 mL Mouth Rinse BID  . methylPREDNISolone (SOLU-MEDROL) injection  40 mg Intravenous Daily  . mirtazapine  7.5 mg Oral Daily  . pantoprazole  40 mg Oral Daily  . polyethylene glycol  17 g Oral Daily  . rivaroxaban  20 mg Oral Daily  . sodium chloride flush  3 mL Intravenous Q12H    Vital Signs    Vitals:   06/13/17 2035 06/14/17 0234 06/14/17 0414 06/14/17 0722  BP:   113/72 97/63  Pulse:   87 81  Resp:   18   Temp:   98.4 F (36.9 C) (!) 97.5 F (36.4 C)  TempSrc:      SpO2: 99% 94% 93% 96%  Weight:      Height:        Intake/Output Summary (Last 24 hours) at 06/14/2017 0802 Last data filed at 06/14/2017 3734 Gross per 24 hour  Intake 1039 ml  Output 900 ml  Net 139 ml   Filed Weights   06/09/17 1853 06/10/17 0435 06/13/17 0449  Weight: 79.1 kg (174 lb 6.1 oz) 77.3 kg (170 lb 6.4 oz) 76.9 kg (169 lb 9.6 oz)    Physical Exam    GEN: Well nourished, well developed, in no acute distress.  HEENT: normal.  Neck: Supple, no JVD, carotid bruits, or masses. Cardiac: irr, no murmurs, rubs, or gallops. No clubbing, cyanosis, edema.  Radials/DP/PT 2+  and equal bilaterally.  Respiratory:  Respirations somewhat labored with activity GI: Soft, nontender, nondistended, BS + x 4. MS: no deformity or atrophy. Skin: warm and dry, no rash. Neuro:  Strength and sensation are intact. Psych: Normal affect.  Labs    CBC Recent Labs    06/12/17 0510  WBC 9.7  HGB 14.2  HCT 41.9  MCV 94.5  PLT 287   Basic Metabolic Panel No results for input(s): NA, K, CL, CO2, GLUCOSE, BUN, CREATININE, CALCIUM, MG, PHOS in the last 72 hours. Liver Function Tests No results for input(s): AST, ALT, ALKPHOS, BILITOT, PROT, ALBUMIN in the last 72 hours. No results for input(s): LIPASE, AMYLASE in the last 72 hours. Cardiac Enzymes No results for input(s): CKTOTAL, CKMB, CKMBINDEX, TROPONINI in the last 72 hours. BNP No results for input(s): BNP in the last 72 hours. D-Dimer No results for input(s): DDIMER in the last 72 hours. Hemoglobin A1C No results for input(s): HGBA1C in the last 72 hours. Fasting Lipid Panel No results for input(s): CHOL, HDL, LDLCALC, TRIG, CHOLHDL, LDLDIRECT in the last 72  hours. Thyroid Function Tests Recent Labs    06/12/17 0510  TSH 0.067*    Telemetry    afib with vr of 84  ECG    afib  Radiology    Dg Chest 1 View  Result Date: 06/10/2017 CLINICAL DATA:  Pneumonia, shortness of breath, cough EXAM: CHEST 1 VIEW COMPARISON:  06/09/2017 FINDINGS: Elevation of the right hemidiaphragm. Cardiomegaly. Diffuse interstitial prominence throughout the lungs appears worsened and may reflect interstitial edema. Bilateral lower lobe airspace opacities, slightly increased on the left since prior study, stable on the right. IMPRESSION: Cardiomegaly with diffuse interstitial prominence, likely interstitial edema. Stable elevation of the right hemidiaphragm. Bibasilar opacities, worsening on the left since prior study concerning for pneumonia. Electronically Signed   By: Rolm Baptise M.D.   On: 06/10/2017 10:24   Dg Chest Port 1  View  Result Date: 06/11/2017 CLINICAL DATA:  Cough, persistent. eval for possible aspiration. EXAM: PORTABLE CHEST 1 VIEW COMPARISON:  06/10/2017 FINDINGS: There is persistent lung base opacity bilaterally, consistent with atelectasis, pneumonia or a combination. Mild cardiomegaly and bilateral interstitial thickening are noted similar to the previous day's study. No visualized pleural effusion. No pneumothorax. IMPRESSION: 1. No significant change from the previous day's study. Persistent lung base opacities that may reflect atelectasis, pneumonia or a combination of both. 2. Mild cardiomegaly and bilateral interstitial thickening. Mild congestive heart failure is suspected. Electronically Signed   By: Lajean Manes M.D.   On: 06/11/2017 18:51   Dg Chest Port 1 View  Result Date: 06/09/2017 CLINICAL DATA:  Pt having flu like symptoms for 7 days. Hx of cancer, CHF, diabetes, hypertension. Former smoker EXAM: PORTABLE CHEST 1 VIEW COMPARISON:  Chest x-rays dated 04/13/2016 and 08/24/2015 FINDINGS: Ill-defined opacities overlying the elevated right hemidiaphragm, highly suspicious for pneumonia. Diffuse bilateral interstitial prominence, similar to previous exam, suspected mild chronic versus recurrent interstitial edema. Heart size and mediastinal contours are stable. Atherosclerotic changes again noted at the aortic arch. No pneumothorax or large pleural effusion seen. No acute or suspicious osseous finding. IMPRESSION: 1. New ill-defined opacities at the right lung base, highly suspicious for pneumonia. 2. Bilateral interstitial prominence, presumably interstitial edema, similar to previous exams suggesting mild chronic versus recurrent CHF/volume overload. 3. Aortic atherosclerosis. Electronically Signed   By: Franki Cabot M.D.   On: 06/09/2017 10:45    Assessment & Plan    afib-rate is improved at present with iv amiodarone. Will d/c iv amiodarone and convert ot po 400 bid. Stay on  xarelto  chf-clinically stable. SOB likely from airspace disease. Appears to be stable when lying prone. May need repeat cxr to follow improvement. SBP remains somewhat low. Will need to be careful with diuresis.   PNA-on abx  Signed, Javier Docker. Yailen Zemaitis MD 06/14/2017, 8:02 AM  Pager: [phone]

## 2017-06-14 NOTE — Plan of Care (Signed)
  Clinical Measurements: Ability to maintain clinical measurements within normal limits will improve 06/14/2017 1747 - Not Progressing by Daron Offer, RN Note BUN worsened today to 27. Will continue to monitor renal function. Wenda Low [facility]

## 2017-06-15 DIAGNOSIS — Z7189 Other specified counseling: Secondary | ICD-10-CM

## 2017-06-15 LAB — GLUCOSE, CAPILLARY
GLUCOSE-CAPILLARY: 139 mg/dL — AB (ref 65–99)
Glucose-Capillary: 169 mg/dL — ABNORMAL HIGH (ref 65–99)

## 2017-06-15 LAB — BASIC METABOLIC PANEL
Anion gap: 11 (ref 5–15)
BUN: 24 mg/dL — AB (ref 6–20)
CHLORIDE: 104 mmol/L (ref 101–111)
CO2: 25 mmol/L (ref 22–32)
Calcium: 8.7 mg/dL — ABNORMAL LOW (ref 8.9–10.3)
Creatinine, Ser: 0.71 mg/dL (ref 0.61–1.24)
GFR calc Af Amer: >60 mL/min (ref >60)
GFR calc non Af Amer: >60 mL/min (ref >60)
Glucose, Bld: 162 mg/dL — ABNORMAL HIGH (ref 65–99)
POTASSIUM: 4.8 mmol/L (ref 3.5–5.1)
SODIUM: 140 mmol/L (ref 135–145)

## 2017-06-15 MED ORDER — MORPHINE SULFATE (CONCENTRATE) 10 MG/0.5ML PO SOLN: 5.0000 mg | ORAL | 0 refills | Status: AC | PRN

## 2017-06-15 MED ORDER — LEVALBUTEROL HCL 0.63 MG/3ML IN NEBU: 0.6300 mg | INHALATION_SOLUTION | RESPIRATORY_TRACT | 12 refills | Status: AC | PRN

## 2017-06-15 MED ORDER — GUAIFENESIN-DM 100-10 MG/5ML PO SYRP: 10.0000 mL | ORAL_SOLUTION | Freq: Four times a day (QID) | ORAL | 0 refills | Status: AC | PRN

## 2017-06-15 MED ORDER — ALPRAZOLAM 0.5 MG PO TABS: 0.5000 mg | ORAL_TABLET | Freq: Two times a day (BID) | ORAL | 0 refills | Status: DC | PRN

## 2017-06-15 MED ORDER — DIGOXIN 125 MCG PO TABS: 0.1250 mg | ORAL_TABLET | Freq: Every day | ORAL | Status: AC

## 2017-06-15 NOTE — NC FL2 (Signed)
[facility] LEVEL OF CARE SCREENING TOOL     IDENTIFICATION  Patient Name: Ruben Gallegos Birthdate: [birth year] Sex: male Admission Date (Current Location): 06/09/2017  [facility] and [place] Number:  Engineering geologist and Address:  [facility], [address] [place], [place] [address]      Provider Number: [id number]  Attending Physician Name and Address:  Dustin Flock, MD  Relative Name and Phone Number:  [name], [name] [phone] or [name], [name] [phone] or [name], [name] [phone]         Current Level of Care: Hospital Recommended Level of Care: [place] Prior Approval Number:    Date Approved/Denied:   PASRR Number: [id number]  Discharge Plan: Other (Comment)([place] unit with Hospice to follow.)    Current Diagnoses: Patient Active Problem List   Diagnosis Date Noted  . Encounter for hospice care discussion   . Cough, persistent   . Paroxysmal atrial fibrillation (H[facility])   . Hypoxia   . Sepsis ([place])   . Acute on chronic systolic heart failure ([place])   . Palliative care encounter   . PNA (pneumonia) 06/09/2017  . Chest pain 04/13/2016  . HLD (hyperlipidemia) 10/15/2015  . Acne erythematosa 10/15/2015  . Diabetes ([place]) 08/25/2015  . HTN (hypertension) 08/25/2015  . Hypothyroidism 08/25/2015  . Acute on chronic systolic CHF (congestive heart failure) ([place]) 08/25/2015  . GERD (gastroesophageal reflux disease) 08/25/2015  . Arteriosclerosis of coronary artery 08/10/2015  . Beat, premature ventricular 08/10/2015  . Paroxysmal supraventricular tachycardia ([place]) 05/21/2015  . H/O deep venous thrombosis 03/23/2015  . Healed or old pulmonary embolism 03/23/2015  . Edema leg 03/23/2015    Orientation RESPIRATION BLADDER Height & Weight     Self, Time, Situation, Place  O2(4L) Continent Weight: 169 lb 9.6 oz (76.9 kg) Height:  6\' 1"  (185.4 cm)  BEHAVIORAL  SYMPTOMS/MOOD NEUROLOGICAL BOWEL NUTRITION STATUS      Continent Diet(Dysphagia 3 diet)  AMBULATORY STATUS COMMUNICATION OF NEEDS Skin   Limited Assist Verbally Normal                       Personal Care Assistance Level of Assistance  Bathing, Feeding, Dressing, Total care Bathing Assistance: Limited assistance Feeding assistance: Limited assistance Dressing Assistance: Limited assistance Total Care Assistance: Limited assistance   Functional Limitations Info  Sight, Hearing, Speech Sight Info: Adequate Hearing Info: Adequate Speech Info: Adequate    SPECIAL CARE FACTORS FREQUENCY  PT (By licensed PT), OT (By licensed OT)     PT Frequency: x5 OT Frequency: x5            Contractures Contractures Info: Not present    Additional Factors Info  Allergies, Code Status, Insulin Sliding Scale, Psychotropic Code Status Info: DNR Allergies Info: ALTACE RAMIPRIL, TRAMADOL, ACTOS PIOGLITAZONE, CODEINE, DEMEROL MEPERIDINE, LIPITOR ATORVASTATIN, METFORMIN AND RELATED, MEVACOR LOVASTATIN  Psychotropic Info: mirtazapine (REMERON) tablet 7.5 mg  Insulin Sliding Scale Info: insulin aspart (novoLOG) injection 0-15 Units        Current Medications (06/15/2017):  This is the current hospital active medication list Current Facility-Administered Medications  Medication Dose Route Frequency Provider Last Rate Last Dose  . acetaminophen (TYLENOL) tablet 325 mg  325 mg Oral TID PRN [name], MD   325 mg at 06/12/17 1030  . ALPRAZolam Duanne Moron) tablet 0.5 mg  0.5 mg Oral BID PRN Gouru, Aruna, MD   0.5 mg at 06/12/17 1024  . amiodarone (PACERONE) tablet 400  mg  400 mg Oral BID Teodoro Spray, MD   400 mg at 06/15/17 0949  . amoxicillin-clavulanate (AUGMENTIN) 875-125 MG per tablet 1 tablet  1 tablet Oral Q12H [name], MD   1 tablet at 06/15/17 0949  . digoxin (LANOXIN) tablet 0.125 mg  0.125 mg Oral Daily Gouru, Aruna, MD   0.125 mg at 06/15/17 0949  . furosemide (LASIX) injection  20 mg  20 mg Intravenous Q12H Dustin Flock, MD   20 mg at 06/15/17 0507  . guaiFENesin (ROBITUSSIN) 100 MG/5ML solution 200 mg  10 mL Oral Q8H Dellinger, Marianne L, PA-C   200 mg at 06/15/17 0510  . guaiFENesin-dextromethorphan (ROBITUSSIN DM) 100-10 MG/5ML syrup 10 mL  10 mL Oral Q6H PRN Gouru, Aruna, MD      . insulin aspart (novoLOG) injection 0-15 Units  0-15 Units Subcutaneous TID WC Gouru, Aruna, MD   3 Units at 06/15/17 1134  . insulin aspart (novoLOG) injection 0-5 Units  0-5 Units Subcutaneous QHS [name], MD   3 Units at 06/12/17 2133  . insulin aspart (novoLOG) injection 5 Units  5 Units Subcutaneous TID WC Dustin Flock, MD   5 Units at 06/15/17 1135  . insulin glargine (LANTUS) injection 15 Units  15 Units Subcutaneous Daily Dustin Flock, MD   15 Units at 06/15/17 [phone]  . ipratropium (ATROVENT) nebulizer solution 0.5 mg  0.5 mg Nebulization Q6H Gouru, Aruna, MD   0.5 mg at 06/15/17 0803  . levalbuterol (XOPENEX) nebulizer solution 0.63 mg  0.63 mg Nebulization Q4H PRN Gouru, Aruna, MD   0.63 mg at 06/11/17 1556  . levalbuterol (XOPENEX) nebulizer solution 0.63 mg  0.63 mg Nebulization Q6H Gouru, Aruna, MD   0.63 mg at 06/15/17 0803  . levothyroxine (SYNTHROID, LEVOTHROID) tablet 100 mcg  100 mcg Oral QAC breakfast Gouru, Aruna, MD   100 mcg at 06/15/17 0748  . MEDLINE mouth rinse  15 mL Mouth Rinse BID Gouru, Aruna, MD   15 mL at 06/15/17 1015  . methylPREDNISolone sodium succinate (SOLU-MEDROL) 40 mg/mL injection 40 mg  40 mg Intravenous Daily Gouru, Aruna, MD   40 mg at 06/15/17 0947  . metoprolol tartrate (LOPRESSOR) injection 5 mg  5 mg Intravenous Q4H PRN Gouru, Aruna, MD      . midodrine (PROAMATINE) tablet 10 mg  10 mg Oral TID WC Dustin Flock, MD   10 mg at 06/15/17 1135  . mirtazapine (REMERON) tablet 7.5 mg  7.5 mg Oral Daily Gouru, Aruna, MD   7.5 mg at 06/15/17 0948  . morphine 2 MG/ML injection 1-2 mg  1-2 mg Intravenous Q1H PRN Dellinger, Marianne L, PA-C       . morphine CONCENTRATE 10 MG/0.5ML oral solution 5 mg  5 mg Oral Q4H PRN Dellinger, Marianne L, PA-C   5 mg at 06/14/17 1727  . mupirocin ointment (BACTROBAN) 2 % 1 application  1 application Nasal BID PRN Gouru, Aruna, MD      . nitroGLYCERIN (NITROSTAT) SL tablet 0.4 mg  0.4 mg Sublingual Q5 min PRN Gouru, Aruna, MD   0.4 mg at 06/12/17 0958  . oxymetazoline (AFRIN) 0.05 % nasal spray 1 spray  1 spray Each Nare BID PRN Gouru, Aruna, MD      . pantoprazole (PROTONIX) EC tablet 40 mg  40 mg Oral Daily Gouru, Aruna, MD   40 mg at 06/15/17 0947  . polyethylene glycol (MIRALAX / GLYCOLAX) packet 17 g  17 g Oral Daily Gouru, Illene Silver, MD  17 g at 06/15/17 0948  . rivaroxaban (XARELTO) tablet 20 mg  20 mg Oral Daily Gouru, Aruna, MD   20 mg at 06/15/17 0949  . senna (SENOKOT) tablet 8.6 mg  1 tablet Oral QHS Dellinger, Marianne L, PA-C   8.6 mg at 06/14/17 2123  . sodium chloride (OCEAN) 0.65 % nasal spray 1 spray  1 spray Each Nare PRN Gouru, Aruna, MD   1 spray at 06/12/17 0252  . sodium chloride flush (NS) 0.9 % injection 3 mL  3 mL Intravenous Q12H Gouru, Aruna, MD   3 mL at 06/15/17 1015     Discharge Medications: Please see discharge summary for a list of discharge medications.  Relevant Imaging Results:  Relevant Lab Results:   Additional Information SSN [id number]  Ross Ludwig, [place]

## 2017-06-15 NOTE — Progress Notes (Signed)
New referral for [facility] services received from [place] following a Palliative Medicine consult. Patient is a 84 year old man with a past medical history of CHF(EF 20-25 % on 3/10), DM II, HTN and hypothyroidism. He came [facility] the [facility] ED on 3/8 with increased shortness of breath, decreased oxygen saturations and tachycardia. He was found on CXR to have right lower lobe pneumonia and started on IV antibiotics as well as IV fluids for hypotension, he also required supplemental oxygen via [place]. Palliative medicine was consulted for goals of care and have met with patient and his family over th past few days. Patient continues to require 4 liters of oxygen and is now only able to transfer from bed to chair due to weakness and dyspnea. He has had good results with liquid morphine PRN for dyspnea. Patient and family have chosen to focus on his comfort with discharge to [facility] with hospice services to follow. Writer met in the room with patient and his sons and daughter in laws to initiate education regarding hospice services, philosophy and team approach to care with good understanding voiced. Patient has had hospice services in the past, family very familiar with care provided. Patient was alert and oriented and di participate in the conversation and agreeable to discharge plan. Hospice contact numbers and information given to patient's son Clare Gandy. Patient information faxed to referral. Patient to discharge to [facility] today via EMS. Signed DNR in place in discharge packet. Flo Shanks RN, BSN, [facility] [place], hospital liaison [phone]

## 2017-06-15 NOTE — Progress Notes (Signed)
Ruben Gallegos to be D/C'd Skilled nursing facility ([facility], report given to Benjamine Mola, Therapist, sports) per MD order.  Discussed prescriptions and follow up appointments with the patient and son. Prescriptions sent with patient, medication list explained in detail. Pt verbalized understanding.  Allergies as of 06/15/2017      Reactions   Altace [ramipril] Shortness Of Breath   Tramadol Shortness Of Breath   Actos [pioglitazone] Swelling   Codeine Nausea Only   Demerol [meperidine] Nausea Only   Lipitor [atorvastatin] Other (See Comments)   Metformin And Related Other (See Comments)   Mevacor [lovastatin] Other (See Comments)      Medication List    STOP taking these medications   glipiZIDE 10 MG tablet Commonly known as:  GLUCOTROL   insulin glargine 100 UNIT/ML injection Commonly known as:  LANTUS     TAKE these medications   acetaminophen 325 MG tablet Commonly known as:  TYLENOL Take 325 mg by mouth 3 (three) times daily.   ALPRAZolam 0.5 MG tablet Commonly known as:  XANAX Take 1 tablet (0.5 mg total) by mouth 2 (two) times daily as needed for anxiety.   carvedilol 3.125 MG tablet Commonly known as:  COREG Take 3.125 mg by mouth 2 (two) times daily.   digoxin 0.125 MG tablet Commonly known as:  LANOXIN Take 1 tablet (0.125 mg total) by mouth daily. Start taking on:  06/16/2017   esomeprazole 40 MG capsule Commonly known as:  NEXIUM Take 40 mg by mouth daily.   furosemide 40 MG tablet Commonly known as:  LASIX Take 1 tablet (40 mg total) by mouth daily. What changed:  how much to take   guaiFENesin-dextromethorphan 100-10 MG/5ML syrup Commonly known as:  ROBITUSSIN DM Take 10 mLs by mouth every 6 (six) hours as needed for cough.   isosorbide mononitrate 30 MG 24 hr tablet Commonly known as:  IMDUR Take 1 tablet (30 mg total) by mouth daily.   levalbuterol 0.63 MG/3ML nebulizer solution Commonly known as:  XOPENEX Take 3 mLs (0.63 mg total) by  nebulization every 4 (four) hours as needed for wheezing or shortness of breath.   levothyroxine 100 MCG tablet Commonly known as:  SYNTHROID, LEVOTHROID Take 100 mcg by mouth daily.   mirtazapine 7.5 MG tablet Commonly known as:  REMERON Take 7.5 mg by mouth daily.   morphine CONCENTRATE 10 MG/0.5ML Soln concentrated solution Take 0.25 mLs (5 mg total) by mouth every 4 (four) hours as needed for anxiety or shortness of breath (mild shortness of breath.).   mupirocin ointment 2 % Commonly known as:  BACTROBAN Place 1 application into the nose 2 (two) times daily.   nitroGLYCERIN 0.4 MG SL tablet Commonly known as:  NITROSTAT Place 0.4 mg under the tongue every 5 (five) minutes as needed for chest pain.   potassium chloride 10 MEQ tablet Commonly known as:  K-DUR Take 10 mEq by mouth daily.   spironolactone 25 MG tablet Commonly known as:  ALDACTONE Take 12.5 mg by mouth daily.   Vitamin D-3 1000 units Caps Take 1,000 Units by mouth daily.   XARELTO 20 MG Tabs tablet Generic drug:  rivaroxaban Take 20 mg by mouth daily.       Vitals:   06/15/17 1136 06/15/17 1559  BP: 117/69 119/74  Pulse: 73   Resp: 18 18  Temp:  (!) 97.5 F (36.4 C)  SpO2: 99% 94%    Tele box removed and returned. Skin clean, dry and intact without evidence  of skin break down, no evidence of skin tears noted. IV catheter discontinued intact. Site without signs and symptoms of complications. Dressing and pressure applied. Pt denies pain at this time. No complaints noted.  An After Visit Summary was printed and given to the patient. Patient escorted via stretcher, and D/C home via [place]

## 2017-06-15 NOTE — Progress Notes (Addendum)
Daily Progress Note   Patient Name: Ruben Gallegos       Date: 06/15/2017 DOB: [birth year]  Age: 84 y.o. MRN#: [id number] Attending Physician: Dustin Flock, MD Primary Care Physician: [name], MD Admit Date: 06/09/2017  Reason for Consultation/Follow-up: Establishing goals of care and Psychosocial/spiritual support  Subjective: Spoke with patient and family at bedside.  He had a hard night.  He became choked and felt like he was going to die.  He also had a blood sugar of 50.  This morning he is alert and chipper.  We discussed his CT scan in comparison to the CT chest of 2017 and 2015. He has had progressive lung disease in addition to some acute changes.  A pulmonology evaluation would likely involve procedures that he does not want to go thru at this point.  The best solution appears to be taking steps to make him happy and comfortable.   We discussed hospice support at [place] facility.   We also discussed [facility] on [facility].  At this point I explained he is too healthy for [facility].    Family and patient are agreeable to Hospice Support at [place] [facility] care.    They presented a living will which I copied and placed on the chart.  We discussed medications - would hospice discontinue them?  I felt that with the exception of Vit D - his other medications need to be continued (synthroid, amio, rivaroxaban) in order to keep him comfortable.  Family feels SL Morphine has been beneficial.    Assessment: 84 yo male with advanced heart failure (EF 15%) with afib/RVR, advanced lung disease with recurrent aspiration, currently on 4L.  Deconditioned and unable to get himself up - out of bed.  Poor appetite.   Patient Profile/HPI:   84 y.o. male with past medical history of CHF (EF 15-20%), DM, hypothyroidism who was admitted on 06/09/2017 with shortness of breath.  He was found to have RLL pneumonia.  He has also developed Afib with RVR that is difficult to control.  The patient has been on and off of venti mask and high flow n/c.    Length of Stay: 6  Current Medications: Scheduled Meds:  . amiodarone  400 mg Oral BID  . amoxicillin-clavulanate  1 tablet Oral Q12H  . cholecalciferol  1,000 Units Oral Daily  . digoxin  0.125 mg Oral Daily  . furosemide  20 mg Intravenous Q12H  . guaiFENesin  10 mL Oral Q8H  . insulin aspart  0-15 Units Subcutaneous TID WC  . insulin aspart  0-5 Units Subcutaneous QHS  . insulin aspart  5 Units Subcutaneous TID WC  . insulin glargine  15 Units Subcutaneous Daily  . ipratropium  0.5 mg Nebulization Q6H  . levalbuterol  0.63 mg Nebulization Q6H  . levothyroxine  100 mcg Oral QAC breakfast  . mouth rinse  15 mL Mouth Rinse BID  . methylPREDNISolone (SOLU-MEDROL) injection  40 mg Intravenous Daily  . midodrine  10 mg Oral TID WC  . mirtazapine  7.5 mg Oral Daily  . pantoprazole  40 mg Oral Daily  . polyethylene glycol  17 g Oral Daily  . rivaroxaban  20 mg Oral Daily  . senna  1 tablet Oral QHS  . sodium chloride flush  3 mL Intravenous Q12H    Continuous Infusions:   PRN Meds: acetaminophen, ALPRAZolam, guaiFENesin-dextromethorphan, levalbuterol, metoprolol tartrate, morphine injection, morphine CONCENTRATE, mupirocin ointment, nitroGLYCERIN, oxymetazoline, sodium chloride  Physical Exam       Well developed elderly male, alert, orientated, coherent Sitting up in bed, appears comfortable on 4L of oxygen.  Multiple family members at bedside.  Vital Signs: BP 119/68 (BP Location: Left Arm)   Pulse 75   Temp (!) 97.5 F (36.4 C) (Oral)   Resp 20   Ht 6\' 1"  (1.854 m)   Wt 76.9 kg (169 lb 9.6 oz)   SpO2 94%   BMI 22.38 kg/m  SpO2: SpO2: 94 % O2 Device: O2 Device: Nasal  Cannula O2 Flow Rate: O2 Flow Rate (L/min): 3.5 L/min  Intake/output summary:   Intake/Output Summary (Last 24 hours) at 06/15/2017 0951 Last data filed at 06/15/2017 0700 Gross per 24 hour  Intake 240 ml  Output 725 ml  Net -485 ml   LBM: Last BM Date: 06/14/17 Baseline Weight: Weight: 81.6 kg (180 lb) Most recent weight: Weight: 76.9 kg (169 lb 9.6 oz)       Palliative Assessment/Data: 30%      Patient Active Problem List   Diagnosis Date Noted  . Cough, persistent   . Paroxysmal atrial fibrillation (HCC)   . Hypoxia   . Sepsis ([place])   . Acute on chronic systolic heart failure ([place])   . Palliative care encounter   . PNA (pneumonia) 06/09/2017  . Chest pain 04/13/2016  . HLD (hyperlipidemia) 10/15/2015  . Acne erythematosa 10/15/2015  . Diabetes ([place]) 08/25/2015  . HTN (hypertension) 08/25/2015  . Hypothyroidism 08/25/2015  . Acute on chronic systolic CHF (congestive heart failure) ([place]) 08/25/2015  . GERD (gastroesophageal reflux disease) 08/25/2015  . Arteriosclerosis of coronary artery 08/10/2015  . Beat, premature ventricular 08/10/2015  . Paroxysmal supraventricular tachycardia ([place]) 05/21/2015  . H/O deep venous thrombosis 03/23/2015  . Healed or old pulmonary embolism 03/23/2015  . Edema leg 03/23/2015    Palliative Care Plan    Recommendations/Plan:  Continue SL morphine PRN  Discontinue Vitamin D  Living Will placed on chart to be scanned into Epic  Patient and family wish for him to return to [facility] at a high level of care (Nursing) with Hospice Support - when medically ready.  Avoid future hospitalization but initiating Hospice Services.  The intention is to one day go to [facility] on [place] (Per  patient)  PMT will sign off.  Please call us back if we can be of assistance.  Goals of Care and Additional Recommendations:  Limitations on Scope of Treatment: Avoid Hospitalization  Code Status:  DNR  Prognosis:   < 6  months due to Advanced Heart disease (EF 12%), afib w/ RVR, Advanced Lung disease (4L of oxygen) with recurrent aspiration, rapid decline, now unable to stand, deconditioning, desire for comfort.   Discharge Planning:  [place] with Hospice  Care plan was discussed with Dr. Posey Pronto and family.  Thank you for allowing the Palliative Medicine Team to assist in the care of this patient.  Total time spent:  60 min.     Greater than 50%  of this time was spent counseling and coordinating care related to the above assessment and plan.  Florentina Jenny, PA-C Palliative Medicine  Please contact Palliative MedicineTeam phone at [phone] for questions and concerns between 7 am - 7 pm.   Please see AMION for individual provider pager numbers.

## 2017-06-15 NOTE — Discharge Summary (Signed)
[name] at [facility], 84 y.o., DOB 24-Nov-1920, MRN [id number]. Admission date: 06/09/2017 Discharge Date 06/15/2017 Primary MD [name], MD Admitting Physician [name], MD  Admission Diagnosis  Hypoxia [R09.02] Troponin I above reference range [R74.8] Sepsis, due to unspecified organism [facility]) [A41.9] Pneumonia of right lung due to infectious organism, unspecified part of lung [J18.9]  Discharge Diagnosis   Active Problems: Acute hypoxic respiratory failure due to pneumonia and bronchospasm Sepsis right lower lobe pneumonia Acute hypoxic respiratory failure Chronic aspiration Bilateral infiltrate possibly due to chronic interstitial lung disease, chronic aspiration Elevated troponin due to demand ischemia acute on chronic systolic CHF high Hypotension Diabetes type 2 Acute COPD failure to thrive I how you     Hospital Course  Patient is a 84 year old white male with history of congestive heart failure, diabetes, hypothyroidism admitted on June 09, 2017 with shortness of breath patient was noted to have right lower lobe pneumonia and was also noticed to patient was treated with patient was treated for have A. fib with RVR.  Pneumonia as well as possible CHF.  Patient continued to have intermittent decline.  Patient had a CT scan of the chest which showed bilateral infiltrates.  Concern is that he may be aspirating or underlying interstitial lung disease.  Pro-calcitonin level is low means that it is not pneumonia.  Patient due to failure to improve palliative care care saw the patient further discussions were held and it was decided to treat patient for symptomatic therapy.  Family has decided to take him back the skilled nursing facility where he resides.  Patient will have a hospice following him there.  He should be transitioned to complete comfort care if he deteriorates.            Consults  cardiology  Significant  Tests:  See full reports for all details     Dg Chest 1 View  Result Date: 06/10/2017 CLINICAL DATA:  Pneumonia, shortness of breath, cough EXAM: CHEST 1 VIEW COMPARISON:  06/09/2017 FINDINGS: Elevation of the right hemidiaphragm. Cardiomegaly. Diffuse interstitial prominence throughout the lungs appears worsened and may reflect interstitial edema. Bilateral lower lobe airspace opacities, slightly increased on the left since prior study, stable on the right. IMPRESSION: Cardiomegaly with diffuse interstitial prominence, likely interstitial edema. Stable elevation of the right hemidiaphragm. Bibasilar opacities, worsening on the left since prior study concerning for pneumonia. Electronically Signed   By: Rolm Baptise M.D.   On: 06/10/2017 10:24   Ct Angio Chest Pe W Or Wo Contrast  Addendum Date: 06/15/2017   ADDENDUM REPORT: 06/15/2017 09:45 ADDENDUM: Comparing toe 08/24/2015 study, the opacities in the lungs are similar but progressed. More confluent opacity in the right upper lobe now present. Differential considerations are broad including infectious or inflammatory processes. This conceivably could represent progression of chronic interstitial lung disease. Electronically Signed   By: Rolm Baptise M.D.   On: 06/15/2017 09:45   Result Date: 06/15/2017 CLINICAL DATA:  Shortness of Breath EXAM: CT ANGIOGRAPHY CHEST WITH CONTRAST TECHNIQUE: Multidetector CT imaging of the chest was performed using the standard protocol during bolus administration of intravenous contrast. Multiplanar CT image reconstructions and MIPs were obtained to evaluate the vascular anatomy. CONTRAST:  35mL ISOVUE-370 IOPAMIDOL (ISOVUE-370) INJECTION 76% COMPARISON:  08/24/2015 FINDINGS: Cardiovascular: No filling defects in the pulmonary arteries to suggest pulmonary emboli. Cardiomegaly. Diffuse coronary artery calcifications. Moderate aortic calcifications. No evidence of aortic aneurysm. Mediastinum/Nodes: Mildly prominent  mediastinal lymph  nodes. Pretracheal lymph node has a short axis diameter of 11 mm. Subcarinal lymph node has a short axis diameter of 15 mm. AP window lymph node has a short axis diameter of 12 mm. No hilar or axillary adenopathy. Lungs/Pleura: Small right pleural effusion. Patchy airspace disease throughout the lungs bilaterally, right greater than left, favor multifocal pneumonia. Calcified pleural plaques noted bilaterally within the upper hemithoraces. Upper Abdomen: Elevation of the right hemidiaphragm. No acute findings. Musculoskeletal: Chest wall soft tissues are unremarkable. No acute bony abnormality. Review of the MIP images confirms the above findings. IMPRESSION: No evidence of pulmonary embolus. Cardiomegaly, severe/diffuse coronary artery disease. Patchy bilateral ground-glass airspace opacities, right greater than left concerning for multifocal pneumonia. Elevation of the right hemidiaphragm with right base atelectasis. Mildly enlarged mediastinal lymph nodes, stable since 2017. Aortic Atherosclerosis (ICD10-I70.0). Electronically Signed: By: Rolm Baptise M.D. On: 06/14/2017 13:47   Dg Chest Port 1 View  Result Date: 06/11/2017 CLINICAL DATA:  Cough, persistent. eval for possible aspiration. EXAM: PORTABLE CHEST 1 VIEW COMPARISON:  06/10/2017 FINDINGS: There is persistent lung base opacity bilaterally, consistent with atelectasis, pneumonia or a combination. Mild cardiomegaly and bilateral interstitial thickening are noted similar to the previous day's study. No visualized pleural effusion. No pneumothorax. IMPRESSION: 1. No significant change from the previous day's study. Persistent lung base opacities that may reflect atelectasis, pneumonia or a combination of both. 2. Mild cardiomegaly and bilateral interstitial thickening. Mild congestive heart failure is suspected. Electronically Signed   By: Lajean Manes M.D.   On: 06/11/2017 18:51   Dg Chest Port 1 View  Result Date:  06/09/2017 CLINICAL DATA:  Pt having flu like symptoms for 7 days. Hx of cancer, CHF, diabetes, hypertension. Former smoker EXAM: PORTABLE CHEST 1 VIEW COMPARISON:  Chest x-rays dated 04/13/2016 and 08/24/2015 FINDINGS: Ill-defined opacities overlying the elevated right hemidiaphragm, highly suspicious for pneumonia. Diffuse bilateral interstitial prominence, similar to previous exam, suspected mild chronic versus recurrent interstitial edema. Heart size and mediastinal contours are stable. Atherosclerotic changes again noted at the aortic arch. No pneumothorax or large pleural effusion seen. No acute or suspicious osseous finding. IMPRESSION: 1. New ill-defined opacities at the right lung base, highly suspicious for pneumonia. 2. Bilateral interstitial prominence, presumably interstitial edema, similar to previous exams suggesting mild chronic versus recurrent CHF/volume overload. 3. Aortic atherosclerosis. Electronically Signed   By: Franki Cabot M.D.   On: 06/09/2017 10:45       Today   Subjective:   Ruben Gallegos patient had a rough night.  He had breathing trouble last night had to receive morphine Objective:   Blood pressure 119/68, pulse 75, temperature (!) 97.5 F (36.4 C), temperature source Oral, resp. rate 20, height 6\' 1"  (1.854 m), weight 169 lb 9.6 oz (76.9 kg), SpO2 94 %.  .  Intake/Output Summary (Last 24 hours) at 06/15/2017 1054 Last data filed at 06/15/2017 1010 Gross per 24 hour  Intake 360 ml  Output 1025 ml  Net -665 ml    Exam VITAL SIGNS: Blood pressure 119/68, pulse 75, temperature (!) 97.5 F (36.4 C), temperature source Oral, resp. rate 20, height 6\' 1"  (1.854 m), weight 169 lb 9.6 oz (76.9 kg), SpO2 94 %.  GENERAL:  84 y.o.-year-old patient lying in the bed with no acute distress.  EYES: Pupils equal, round, reactive to light and accommodation. No scleral icterus. Extraocular muscles intact.  HEENT: Head atraumatic, normocephalic. Oropharynx and nasopharynx  clear.  NECK:  Supple, no jugular venous distention. No thyroid  enlargement, no tenderness.  LUNGS: Rhonchus breath sounds bilaterally  cARDIOVASCULAR: S1, S2 normal. No murmurs, rubs, or gallops.  ABDOMEN: Soft, nontender, nondistended. Bowel sounds present. No organomegaly or mass.  EXTREMITIES: No pedal edema, cyanosis, or clubbing.  NEUROLOGIC: Cranial nerves II through XII are intact. Muscle strength 5/5 in all extremities. Sensation intact. Gait not checked.  PSYCHIATRIC: The patient is alert and oriented x 3.  SKIN: No obvious rash, lesion, or ulcer.   Data Review     CBC w Diff:  Lab Results  Component Value Date   WBC 9.7 06/12/2017   HGB 14.2 06/12/2017   HGB 13.1 03/04/2014   HCT 41.9 06/12/2017   HCT 39.4 (L) 03/04/2014   PLT 169 06/12/2017   PLT 191 03/04/2014   LYMPHOPCT 13 06/09/2017   LYMPHOPCT 27.9 03/04/2014   MONOPCT 12 06/09/2017   MONOPCT 13.1 03/04/2014   EOSPCT 0 06/09/2017   EOSPCT 5.2 03/04/2014   BASOPCT 0 06/09/2017   BASOPCT 0.9 03/04/2014   CMP:  Lab Results  Component Value Date   NA 140 06/15/2017   NA 138 03/04/2014   K 4.8 06/15/2017   K 3.7 03/04/2014   CL 104 06/15/2017   CL 104 03/04/2014   CO2 25 06/15/2017   CO2 26 03/04/2014   BUN 24 (H) 06/15/2017   BUN 16 03/04/2014   CREATININE 0.71 06/15/2017   CREATININE 0.85 03/04/2014   PROT 7.7 06/09/2017   PROT 6.6 10/17/2013   ALBUMIN 3.4 (L) 06/09/2017   ALBUMIN 2.4 (L) 10/17/2013   BILITOT 1.1 06/09/2017   BILITOT 0.8 10/17/2013   ALKPHOS 60 06/09/2017   ALKPHOS 60 10/17/2013   AST 29 06/09/2017   AST 26 10/17/2013   ALT 18 06/09/2017   ALT 39 10/17/2013  .  Micro Results Recent Results (from the past 240 hour(s))  Culture, blood (routine x 2)     Status: None   Collection Time: 06/09/17 10:55 AM  Result Value Ref Range Status   Specimen Description BLOOD BLOOD LEFT FOREARM  Final   Special Requests   Final    BOTTLES DRAWN AEROBIC AND ANAEROBIC Blood Culture  results may not be optimal due to an excessive volume of blood received in culture bottles   Culture   Final    NO GROWTH 5 DAYS Performed at [facility], [address] [place], [place] [address]    Report Status 06/14/2017 FINAL  Final  Culture, blood (routine x 2)     Status: None   Collection Time: 06/09/17 10:56 AM  Result Value Ref Range Status   Specimen Description BLOOD LEFT ANTECUBITAL  Final   Special Requests   Final    BOTTLES DRAWN AEROBIC AND ANAEROBIC Blood Culture adequate volume   Culture   Final    NO GROWTH 5 DAYS Performed at [facility], [address], [place], [place] [address]    Report Status 06/14/2017 FINAL  Final  Urine culture     Status: None   Collection Time: 06/09/17 10:57 AM  Result Value Ref Range Status   Specimen Description   Final    URINE, RANDOM Performed at [facility], [address], [place], [place] [address]    Special Requests   Final    NONE Performed at [facility], [address], [place], [place] [address]    Culture   Final    NO GROWTH Performed at [facility], [place] [address], [place], [place]  [address]    Report Status 06/11/2017 FINAL  Final  MRSA PCR Screening     Status: None   Collection Time: 06/09/17  4:36 PM  Result Value Ref Range Status   MRSA by PCR NEGATIVE NEGATIVE Final    Comment:        The GeneXpert MRSA Assay (FDA approved for NASAL specimens only), is one component of a comprehensive MRSA colonization surveillance program. It is not intended to diagnose MRSA infection nor to guide or monitor treatment for MRSA infections. Performed at [facility], [address] [place], [place] [address]   Culture, sputum-assessment     Status: None   Collection Time: 06/09/17  5:34 PM  Result Value Ref Range Status   Specimen Description SPUTUM  Final   Special Requests NONE  Final   Sputum evaluation   Final    Sputum specimen not acceptable  for testing.  Please recollect.   RESULT CALLED TO, READ BACK BY AND VERIFIED WITH: [place] ON 06/09/17 TO BE RE COLLECTED/RWW Performed at [facility], [address] [place], [place] [address]    Report Status 06/09/2017 FINAL  Final        Code Status Orders  (From admission, onward)        Start     Ordered   06/09/17 1619  Do not attempt resuscitation (DNR)  Continuous    Question Answer Comment  In the event of cardiac or respiratory ARREST Do not call a "code blue"   In the event of cardiac or respiratory ARREST Do not perform Intubation, CPR, defibrillation or ACLS   In the event of cardiac or respiratory ARREST Use medication by any route, position, wound care, and other measures to relive pain and suffering. May use oxygen, suction and manual treatment of airway obstruction as needed for comfort.   Comments RN may pronounce      06/09/17 1618    Code Status History    Date Active Date Inactive Code Status Order ID Comments User Context   04/13/2016 07:04 04/14/2016 16:43 DNR [id number]  Saundra Shelling, MD ED   08/25/2015 01:16 08/27/2015 14:37 DNR [id number]  Lance Coon, MD Inpatient    Advance Directive Documentation     Most Recent Value  Type of Advance Directive  Living will  Pre-existing out of facility DNR order (yellow form or pink MOST form)  No data  "MOST" Form in Place?  No data            Discharge Medications   Allergies as of 06/15/2017      Reactions   Altace [ramipril] Shortness Of Breath   Tramadol Shortness Of Breath   Actos [pioglitazone] Swelling   Codeine Nausea Only   Demerol [meperidine] Nausea Only   Lipitor [atorvastatin] Other (See Comments)   Metformin And Related Other (See Comments)   Mevacor [lovastatin] Other (See Comments)      Medication List    STOP taking these medications   glipiZIDE 10 MG tablet Commonly known as:  GLUCOTROL   insulin glargine 100 UNIT/ML injection Commonly known as:   LANTUS     TAKE these medications   acetaminophen 325 MG tablet Commonly known as:  TYLENOL Take 325 mg by mouth 3 (three) times daily.   ALPRAZolam 0.5 MG tablet Commonly known as:  XANAX Take 1 tablet (0.5 mg total) by mouth 2 (two) times daily as needed for anxiety.   carvedilol 3.125 MG tablet Commonly known  as:  COREG Take 3.125 mg by mouth 2 (two) times daily.   digoxin 0.125 MG tablet Commonly known as:  LANOXIN Take 1 tablet (0.125 mg total) by mouth daily. Start taking on:  06/16/2017   esomeprazole 40 MG capsule Commonly known as:  NEXIUM Take 40 mg by mouth daily.   furosemide 40 MG tablet Commonly known as:  LASIX Take 1 tablet (40 mg total) by mouth daily. What changed:  how much to take   guaiFENesin-dextromethorphan 100-10 MG/5ML syrup Commonly known as:  ROBITUSSIN DM Take 10 mLs by mouth every 6 (six) hours as needed for cough.   isosorbide mononitrate 30 MG 24 hr tablet Commonly known as:  IMDUR Take 1 tablet (30 mg total) by mouth daily.   levalbuterol 0.63 MG/3ML nebulizer solution Commonly known as:  XOPENEX Take 3 mLs (0.63 mg total) by nebulization every 4 (four) hours as needed for wheezing or shortness of breath.   levothyroxine 100 MCG tablet Commonly known as:  SYNTHROID, LEVOTHROID Take 100 mcg by mouth daily.   mirtazapine 7.5 MG tablet Commonly known as:  REMERON Take 7.5 mg by mouth daily.   morphine CONCENTRATE 10 MG/0.5ML Soln concentrated solution Take 0.25 mLs (5 mg total) by mouth every 4 (four) hours as needed for anxiety or shortness of breath (mild shortness of breath.).   mupirocin ointment 2 % Commonly known as:  BACTROBAN Place 1 application into the nose 2 (two) times daily.   nitroGLYCERIN 0.4 MG SL tablet Commonly known as:  NITROSTAT Place 0.4 mg under the tongue every 5 (five) minutes as needed for chest pain.   potassium chloride 10 MEQ tablet Commonly known as:  K-DUR Take 10 mEq by mouth daily.    spironolactone 25 MG tablet Commonly known as:  ALDACTONE Take 12.5 mg by mouth daily.   Vitamin D-3 1000 units Caps Take 1,000 Units by mouth daily.   XARELTO 20 MG Tabs tablet Generic drug:  rivaroxaban Take 20 mg by mouth daily.          Total Time in preparing paper work, data evaluation and todays exam - 88 minutes  Dustin Flock M.D on 06/15/2017 at 10:54 AM [place]  [phone]

## 2017-06-15 NOTE — Progress Notes (Addendum)
Report called to Benjamine Mola, Therapist, sports at the [place], [place] called for transportation.

## 2017-06-15 NOTE — Clinical Social Work Placement (Signed)
   CLINICAL SOCIAL WORK PLACEMENT  NOTE  Date:  06/15/2017  Patient Details  Name: Ruben Gallegos MRN: [id number] Date of Birth: May 30, 1920  Clinical Social Work is seeking post-discharge placement for this patient at the [place] level of care (*CSW will initial, date and re-position this form in  chart as items are completed):  Yes   Patient/family provided with [place] Work Department's list of facilities offering this level of care within the geographic area requested by the patient (or if unable, by the patient's family).  Yes   Patient/family informed of their freedom to choose among providers that offer the needed level of care, that participate in Medicare, Medicaid or managed care program needed by the patient, have an available bed and are willing to accept the patient.  Yes   Patient/family informed of [facility]'s ownership interest in [facility] and [facility], as well as of the fact that they are under no obligation to receive care at these facilities.  PASRR submitted to EDS on 06/15/17     PASRR number received on 06/15/17     Existing PASRR number confirmed on       FL2 transmitted to all facilities in geographic area requested by pt/family on 06/15/17     FL2 transmitted to all facilities within larger geographic area on       Patient informed that his/her managed care company has contracts with or will negotiate with certain facilities, including the following:        Yes   Patient/family informed of bed offers received.  Patient chooses bed at [facility] at the [place])     Physician recommends and patient chooses bed at      Patient to be transferred to [facility] at the [place]) on 06/15/17.  Patient to be transferred to facility by [facility]     Patient family notified on 06/15/17 of transfer.  Name of family member notified:  Patient's son were at bedside [facility], and  [facility] Please sign DNR     Additional Comment:    _______________________________________________ Ross Ludwig, [place] 06/15/2017, 2:55 PM

## 2017-06-15 NOTE — Plan of Care (Signed)
  Progressing Activity: Risk for activity intolerance will decrease 06/15/2017 0215 - Progressing by Loran Senters, RN Pain Managment: General experience of comfort will improve 06/15/2017 0215 - Progressing by Loran Senters, RN Activity: Ability to tolerate increased activity will improve 06/15/2017 0215 - Progressing by Loran Senters, RN

## 2017-06-15 NOTE — Clinical Social Work Note (Signed)
CSW spoke to patient's family in regards to hospice agencies they would like to follow him at [facility] at the [place], and they chose [facility] and [facility].  CSW contacted  and [facility], to make the referral.  Patient to be d/c'ed today to [place] at [facility].  Patient and family agreeable to plans will transport via ems RN to call report to room 355, [phone].  Patient's sons were at bedside, and are aware that patient will be discharging today.  Evette Cristal, MSW, [place]

## 2017-06-16 ENCOUNTER — Non-Acute Institutional Stay (SKILLED_NURSING_FACILITY): Payer: Medicare Other | Admitting: Gerontology

## 2017-06-16 ENCOUNTER — Other Ambulatory Visit: Payer: Self-pay

## 2017-06-16 DIAGNOSIS — J181 Lobar pneumonia, unspecified organism: Secondary | ICD-10-CM

## 2017-06-16 DIAGNOSIS — I110 Hypertensive heart disease with heart failure: Secondary | ICD-10-CM | POA: Insufficient documentation

## 2017-06-16 DIAGNOSIS — J189 Pneumonia, unspecified organism: Secondary | ICD-10-CM

## 2017-06-16 DIAGNOSIS — Z515 Encounter for palliative care: Secondary | ICD-10-CM

## 2017-06-16 MED ORDER — ALPRAZOLAM 0.5 MG PO TABS: 0.5000 mg | ORAL_TABLET | Freq: Two times a day (BID) | ORAL | 1 refills | Status: DC | PRN

## 2017-06-16 NOTE — Telephone Encounter (Signed)
Rx sent to [facility] phone : [phone] , fax : [phone]  

## 2017-06-16 NOTE — Progress Notes (Signed)
Location:   [facility] Room Number: [phone] Place of Service:  SNF [phone]) Provider:  Toni Arthurs, NP-C  [name], MD  Patient Care Team: [name], MD as PCP - General (Internal Medicine)  Extended Emergency Contact Information Primary Emergency Contact: Bilyk,Ted Address: [address] [place], [place] [address] Johnnette Litter of [place] Phone: [phone] Relation: Son Secondary Emergency Contact: Patrica Duel Address: [address]          [place], [place] [address] Johnnette Litter of [place] Phone: [phone] Work Phone: [phone] Mobile Phone: [phone] Relation: Son  Code Status:  DNR Goals of care: Advanced Directive information Advanced Directives 06/16/2017  Does Patient Have a Medical Advance Directive? Yes  Type of Advance Directive Out of facility DNR (pink MOST or yellow form)  Does patient want to make changes to medical advance directive? No - Patient declined  Copy of [place] in Chart? -  Would patient like information on creating a medical advance directive? -  Pre-existing out of facility DNR order (yellow form or pink MOST form) -     Chief Complaint  Patient presents with  . Hospitalization Follow-up    Follow-up on Pneumonia    HPI:  Pt is a 84 y.o. male seen today for a hospital f/u s/p admission from [facility] for Respiratory failure and sepsis due to pneumonia. Pt was seen by Palliative Care while in the hospital. After family discussions, patient and family decided to make the patient comfort care only. He was discharged from [facility] and admitted to the skilled nursing unit for EOL Care. While in the hospital, he was seen by SLP. It was determined he was an aspiration risk and he needed to be on D3 diet with Nectar thick liquids. However, d/t his status of End of Life care, the son requests he be on pleasure feeds with consistency of choice. Son/patient is well aware of  risks and the recommendations, but would like to decline the recommendations. Pt was found to have an EF of 10-15%. Prior to admission, pt was able to ambulate in his apartment or very short distances and was otherwise mobile on his scooter without O2. He is now O2 dependent on 4 L [place] and unable to ambulate more than 10 feet comfortably. Pt is well aware of his current state of health and prognosis. He seems to be comfortable and at peace with this. Pt is scheduled to be opened to Hospice services later today. Most medications left as active orders, but will reduce non-essentials when appropriate. Lengthy discussion had with son and patient about their goals of care. At this time, pt denies pain and shortness of breath. VSS. No other complaints.   Past Medical History:  Diagnosis Date  . Benign essential HTN 06/07/2017  . Cancer ([place]) 1988   melonoma   . Cardiomyopathy, secondary ([place])   . CHF (congestive heart failure) (HCC)    LVEF 22% 3/12  . Coronary artery disease    h/o MI s/p PCI and stent LAD 3/05. followed by Dr. Nehemiah Massed  . Diabetes mellitus type 2, uncomplicated ([place])   . Diabetes mellitus without complication ([place])   . Dvt femoral (deep venous thrombosis) (HCC)    left leg 10/11  . GERD (gastroesophageal reflux disease)   . Heart disease   . Hernia cerebri ([place])   . History of melanoma    resected  from chest wall  . History of pulmonary embolism 03/23/2015  . HLD (hyperlipidemia)   . Hyperlipidemia, unspecified   . Hypertension   . Hypothyroidism    unspecified  . Myocardial infarction ([place]) 2007  . Rosacea   . Seborrhea   . Venous disease    Past Surgical History:  Procedure Laterality Date  . ANGIOPLASTY / STENTING FEMORAL    . APPENDECTOMY    . CARDIAC CATHETERIZATION  2007  . CARDIAC SURGERY  2007  . CATARACT EXTRACTION    . CORONARY ANGIOPLASTY  2007  . DIAPHRAGMATIC HERNIA REPAIR    . HERNIA REPAIR     EMERGENT 7/15  . ORIF GREATER TROCHANTERIC FRACTURE  Left 06/2009  . SKIN BIOPSY    . TREATMENT HIP DISLOCATION W/ACETABULAR WALL/FEMORAL HEAD FRACTURE OPEN Right     Allergies  Allergen Reactions  . Pioglitazone Swelling  . Ramipril Shortness Of Breath    Other reaction(s): Dizziness  . Tramadol Shortness Of Breath    Other reaction(s): Dizziness  . Atorvastatin Other (See Comments)    Other reaction(s): Unknown Joint pain   . Codeine Nausea Only  . Demerol [Meperidine] Nausea Only  . Metformin      Abdominal Pain  . Metformin And Related Other (See Comments)  . Mevacor [Lovastatin] Other (See Comments)    Allergies as of 06/16/2017      Reactions   Pioglitazone Swelling   Ramipril Shortness Of Breath   Other reaction(s): Dizziness   Tramadol Shortness Of Breath   Other reaction(s): Dizziness   Atorvastatin Other (See Comments)   Other reaction(s): Unknown Joint pain   Codeine Nausea Only   Demerol [meperidine] Nausea Only   Metformin     Abdominal Pain   Metformin And Related Other (See Comments)   Mevacor [lovastatin] Other (See Comments)      Medication List        Accurate as of 06/16/17  2:41 PM. Always use your most recent med list.          acetaminophen 325 MG tablet Commonly known as:  TYLENOL Take 325 mg by mouth 3 (three) times daily.   ALPRAZolam 0.5 MG tablet Commonly known as:  XANAX Take 1 tablet (0.5 mg total) by mouth 2 (two) times daily as needed for up to 14 days for anxiety.   carvedilol 3.125 MG tablet Commonly known as:  COREG Take 3.125 mg by mouth 2 (two) times daily.   digoxin 0.125 MG tablet Commonly known as:  LANOXIN Take 1 tablet (0.125 mg total) by mouth daily.   esomeprazole 40 MG capsule Commonly known as:  NEXIUM Take 40 mg by mouth daily.   furosemide 40 MG tablet Commonly known as:  LASIX Take 40 mg by mouth daily.   guaiFENesin-dextromethorphan 100-10 MG/5ML syrup Commonly known as:  ROBITUSSIN DM Take 10 mLs by mouth every 6 (six) hours as needed for  cough.   isosorbide mononitrate 30 MG 24 hr tablet Commonly known as:  IMDUR Take 1 tablet (30 mg total) by mouth daily.   levalbuterol 0.63 MG/3ML nebulizer solution Commonly known as:  XOPENEX Take 3 mLs (0.63 mg total) by nebulization every 4 (four) hours as needed for wheezing or shortness of breath.   levothyroxine 100 MCG tablet Commonly known as:  SYNTHROID, LEVOTHROID Take 100 mcg by mouth daily.   mirtazapine 7.5 MG tablet Commonly known as:  REMERON Take 7.5 mg by mouth daily.   morphine CONCENTRATE 10 MG/0.5ML Soln concentrated solution Take  0.25 mLs (5 mg total) by mouth every 4 (four) hours as needed for anxiety or shortness of breath (mild shortness of breath.).   mupirocin ointment 2 % Commonly known as:  BACTROBAN Place 1 application into the nose 2 (two) times daily.   nitroGLYCERIN 0.4 MG SL tablet Commonly known as:  NITROSTAT Place 0.4 mg under the tongue every 5 (five) minutes as needed for chest pain.   potassium chloride 10 MEQ tablet Commonly known as:  K-DUR Take 10 mEq by mouth daily.   spironolactone 25 MG tablet Commonly known as:  ALDACTONE Take 12.5 mg by mouth daily.   Vitamin D-3 1000 units Caps Take 1,000 Units by mouth daily.   XARELTO 20 MG Tabs tablet Generic drug:  rivaroxaban Take 20 mg by mouth daily.       Review of Systems  Constitutional: Positive for activity change, appetite change and fatigue. Negative for chills, diaphoresis and fever.  HENT: Negative for congestion, mouth sores, nosebleeds, postnasal drip, sneezing, sore throat, trouble swallowing and voice change.   Respiratory: Negative for apnea, cough, choking, chest tightness, shortness of breath and wheezing.   Cardiovascular: Negative for chest pain, palpitations and leg swelling.  Gastrointestinal: Negative for abdominal distention, abdominal pain, constipation, diarrhea and nausea.  Genitourinary: Negative for difficulty urinating, dysuria, frequency and  urgency.  Musculoskeletal: Negative for back pain, gait problem and myalgias. Arthralgias: typical arthritis.  Skin: Negative for color change, pallor, rash and wound.  Neurological: Positive for weakness. Negative for dizziness, tremors, syncope, speech difficulty, numbness and headaches.  Psychiatric/Behavioral: Negative for agitation and behavioral problems.  All other systems reviewed and are negative.   Immunization History  Administered Date(s) Administered  . Influenza Split 01/02/2014, 01/19/2015  . Influenza, High Dose Seasonal PF 01/03/2016  . Influenza-Unspecified 12/26/2016  . Pneumococcal Conjugate-13 04/20/2015, 05/05/2015  . Pneumococcal Polysaccharide-23 12/13/2011  . Tdap 04/05/2007  . Zoster 12/12/2007   Pertinent  Health Maintenance Due  Topic Date Due  . FOOT EXAM  08/29/1930  . OPHTHALMOLOGY EXAM  08/29/1930  . URINE MICROALBUMIN  08/29/1930  . PNA vac Low Risk Adult (1 of 2 - PCV13) 08/30/1987  . HEMOGLOBIN A1C  12/11/2017  . INFLUENZA VACCINE  Completed   No flowsheet data found. Functional Status Survey:    Vitals:   06/16/17 1422  BP: 114/68  Pulse: 67  Resp: 20  Temp: 98.3 F (36.8 C)  TempSrc: Oral  SpO2: 96%  Weight: 166 lb 6.4 oz (75.5 kg)  Height: 6' (1.829 m)   Body mass index is 22.57 kg/m. Physical Exam  Constitutional: He is oriented to person, place, and time. Vital signs are normal. He appears well-developed and well-nourished. He is active and cooperative. He has a sickly appearance. He does not appear ill. No distress. Nasal cannula in place.  HENT:  Head: Normocephalic and atraumatic.  Mouth/Throat: Uvula is midline, oropharynx is clear and moist and mucous membranes are normal. Mucous membranes are not pale, not dry and not cyanotic.  Eyes: Conjunctivae, EOM and lids are normal. Pupils are equal, round, and reactive to light.  Neck: Trachea normal, normal range of motion and full passive range of motion without pain. Neck  supple. No JVD present. No tracheal deviation, no edema and no erythema present. No thyromegaly present.  Cardiovascular: Normal rate, intact distal pulses and normal pulses. An irregular rhythm present. Exam reveals distant heart sounds. Exam reveals no gallop and no friction rub.  No murmur heard. Pulses:  Dorsalis pedis pulses are 2+ on the right side, and 2+ on the left side.  No edema  Pulmonary/Chest: Effort normal. No accessory muscle usage. No respiratory distress. He has decreased breath sounds in the right lower field and the left lower field. He has no wheezes. He has no rhonchi. He has no rales. He exhibits no tenderness.  Dyspnea on exertion  Abdominal: Soft. Normal appearance and bowel sounds are normal. He exhibits no distension and no ascites. There is no tenderness.  Musculoskeletal: Normal range of motion. He exhibits no edema or tenderness.  Expected osteoarthritis, stiffness; Bilateral Calves soft, supple. Negative Homan's Sign. B- pedal pulses equal; generalized weakness  Neurological: He is alert and oriented to person, place, and time. He has normal strength. He exhibits abnormal muscle tone. Coordination and gait abnormal.  Skin: Skin is warm, dry and intact. He is not diaphoretic. No cyanosis. No pallor. Nails show no clubbing.  Psychiatric: He has a normal mood and affect. His speech is normal and behavior is normal. Judgment and thought content normal. Cognition and memory are normal.  Nursing note and vitals reviewed.   Labs reviewed: Recent Labs    06/11/17 0336 06/14/17 1041 06/15/17 0544  NA 137 135 140  K 3.6 4.4 4.8  CL 105 103 104  CO2 22 23 25   GLUCOSE 350* 168* 162*  BUN 22* 27* 24*  CREATININE 0.94 0.85 0.71  CALCIUM 8.3* 8.3* 8.7*   Recent Labs    06/09/17 1020  AST 29  ALT 18  ALKPHOS 60  BILITOT 1.1  PROT 7.7  ALBUMIN 3.4*   Recent Labs    06/09/17 1020 06/12/17 0510  WBC 5.1 9.7  NEUTROABS 3.8  --   HGB 14.8 14.2  HCT 44.5  41.9  MCV 96.0 94.5  PLT 185 169   Lab Results  Component Value Date   TSH 0.067 (L) 06/12/2017   Lab Results  Component Value Date   HGBA1C 7.8 (H) 06/10/2017   Lab Results  Component Value Date   CHOL 150 04/14/2016   HDL 28 (L) 04/14/2016   LDLCALC 95 04/14/2016   TRIG 137 04/14/2016   CHOLHDL 5.4 04/14/2016    Significant Diagnostic Results in last 30 days:  Dg Chest Port 1 View  Result Date: 06/11/2017 CLINICAL DATA:  Cough, persistent. eval for possible aspiration. EXAM: PORTABLE CHEST 1 VIEW COMPARISON:  06/10/2017 FINDINGS: There is persistent lung base opacity bilaterally, consistent with atelectasis, pneumonia or a combination. Mild cardiomegaly and bilateral interstitial thickening are noted similar to the previous day's study. No visualized pleural effusion. No pneumothorax. IMPRESSION: 1. No significant change from the previous day's study. Persistent lung base opacities that may reflect atelectasis, pneumonia or a combination of both. 2. Mild cardiomegaly and bilateral interstitial thickening. Mild congestive heart failure is suspected. Electronically Signed   By: Lajean Manes M.D.   On: 06/11/2017 18:51    Assessment/Plan Pneumonia of right lower lobe due to infectious organism ([place])  Coarse of antibiotics completed prior to admission  Continue Xopenex 0.63 mg/3 mL Q 4 hours prn- dyspnea, wheeze, etc  Continue Robitussin DM 10 mL PO Q 6 hours prn- cough  Continue O2 4L [place]- titrate for comfort  Hypertensive heart disease with heart failure (HCC)  Open to Hospice services today  Continue Spironolactone 12.5 mg po Q Day  Continue Lasix 40 mg po Q Day  Continue Potassium chloride 10 MEQ po Q Day  Continue Imdur 30 mg po Q Day  Continue Coreg 3.125 mg po BID  Continue Digoxin 0.125 mg po Q Day  Utilize urinal and BSC for energy conservation prn  Encounter for Dying Care  Open to Hospice services today  Regular diet with thin liquids- pleasure  feeds  Pt may have alcoholic beverage daily prn- family to provide  Morphine 20 mg/mL- 0.25 mL PO Q 4 hours prn- dyspnea- will adjust as appropriate  Continue xanax 0.5 mg PO BID prn- anxiety, dyspnea  DNR  OOB to chair as desired  Family/ staff Communication: son and caregiver at bedside  Total Time:  Documentation:  Face to Face:  Family/Phone:   Labs/tests ordered:    Vikki Ports, NP-C [facility] [address], [place] [address] Cell Phone (Mon-Fri 8am-5pm):  [phone] On Call:  [phone] & follow prompts after 5pm & weekends Office Phone:  [phone] Office Fax:  [phone]

## 2017-06-23 ENCOUNTER — Encounter: Payer: Self-pay | Admitting: Gerontology

## 2017-06-23 ENCOUNTER — Non-Acute Institutional Stay (SKILLED_NURSING_FACILITY): Payer: Medicare Other | Admitting: Gerontology

## 2017-06-23 DIAGNOSIS — I110 Hypertensive heart disease with heart failure: Secondary | ICD-10-CM

## 2017-06-23 DIAGNOSIS — Z515 Encounter for palliative care: Secondary | ICD-10-CM | POA: Diagnosis not present

## 2017-06-23 DIAGNOSIS — R0609 Other forms of dyspnea: Secondary | ICD-10-CM

## 2017-06-23 NOTE — Progress Notes (Signed)
Location:   [facility] Room Number: [phone] Place of Service:  SNF [phone]) Provider:  Toni Arthurs, NP-C  [name], MD  Patient Care Team: [name], MD as PCP - General (Internal Medicine)  Extended Emergency Contact Information Primary Emergency Contact: Isidoro,Ted Address: [address], [place] [address] Johnnette Litter of [place] Phone: [phone] Relation: Son Secondary Emergency Contact: Patrica Duel Address: [address]          [place], [place] [address] Johnnette Litter of [place] Phone: [phone] Work Phone: [phone] Mobile Phone: [phone] Relation: Son  Code Status:  DNR Goals of care: Advanced Directive information Advanced Directives 06/23/2017  Does Patient Have a Medical Advance Directive? Yes  Type of Advance Directive Out of facility DNR (pink MOST or yellow form)  Does patient want to make changes to medical advance directive? No - Patient declined  Copy of [place] in Chart? -  Would patient like information on creating a medical advance directive? -  Pre-existing out of facility DNR order (yellow form or pink MOST form) -     Chief Complaint  Patient presents with  . Medical Management of Chronic Issues    Routine Visit    HPI:  Pt is a 84 y.o. male seen today for medical management of chronic diseases.  Patient was admitted to the facility for end-of-life/comfort care.  Patient was recently discharged from [facility] for pneumonia/respiratory failure.  Patient has been at the facility for 1 week.  Patient was admitted under hospice care services upon admission to the facility.  Comfort measures have been in place.  Over the past week, patient has shown a marked decline in respiratory ability and anxiety, likely related to the respiratory distress.  Yesterday, patient required multiple doses of alprazolam and Roxanol for the dyspnea and associated anxiety.  Patient did  not receive any during the night as the medication was ordered as needed.  Patient was found to be in distress upon assessment by nursing first thing this morning.  Patient was visited by hospice nurse, who then expressed her frustration to me at the patient's lack of comfort.  Hospice nurse suggested scheduling the alprazolam 3 times a day and the Roxanol (low-dose) every 4 hours and every hour as needed.  Patient's daughter-in-law was upset the medication was scheduled.  Patient has said several times that he does not want to bother staff with requests.  Therefore, he does not ask for medications when they are needed.  However, he does accept them every time they are offered.  I had a long discussion with the daughter-in-law with the patient's son and caregiver present.  We identified the family and I have the same goal in mind-keeping the patient comfortable and not in distress with the use of as little medication as possible to prevent sedation.  DIL verbalized she did not want the patient to receive much medication because the family wanted him to be awake and alert when they come to see him.  Educated family extensively about the use of the medications and the purpose, the fact that the small dose he is given is not sedating as well as the fact that he was on the alprazolam at home at the same dose prior to admission.  Also discussed that the sedation they are seeing is likely from exhaustion from patient having to work or to breathe, then  being sleepy when he finally gets comfortable.  DIL was concerned the dose of medication may be too high because of his age and weekend organ functioning.  I assured patient & family that I am very familiar and comfortable with the medications order and dosing, especially taking into account patient's age, size/debility and organ function.  The patient is alert and oriented.  I really emphasized several times that the patient has the right to refuse the medication if it is  not needed or wanted.  I explained the logic behind having scheduled doses as opposed to as needed only (ie.  Staying ahead of the symptoms by using less medication versus using a larger dose of medication to play catch up when symptoms are severe.)  Also explained that medication dosages were always changing.  Doses were adjusted/titrated based on patient need an intolerance.  Patient acknowledged understanding of the conversation.  Patient expressed his gratitude for my coming by to check on him and to ensure he was comfortable.  During the time of the conversation, patient was alert and oriented.  He was being fed his lunch by his caregiver.  He was comfortable and not short of breath.  Patient denies chest pain shortness of breath.  O2 4 L nasal cannula in place.  Vital signs stable.  No other complaints.     Past Medical History:  Diagnosis Date  . Benign essential HTN 06/07/2017  . Cancer ([facility]) 1988   melonoma   . Cardiomyopathy, secondary ([facility])   . CHF (congestive heart failure) (HCC)    LVEF 22% 3/12  . Coronary artery disease    h/o MI s/p PCI and stent LAD 3/05. followed by Dr. Nehemiah Massed  . Diabetes mellitus type 2, uncomplicated ([place])   . Diabetes mellitus without complication ([place])   . Dvt femoral (deep venous thrombosis) (HCC)    left leg 10/11  . GERD (gastroesophageal reflux disease)   . Heart disease   . Hernia cerebri ([facility] [facility])   . History of melanoma    resected from chest wall  . History of pulmonary embolism 03/23/2015  . HLD (hyperlipidemia)   . Hyperlipidemia, unspecified   . Hypertension   . Hypothyroidism    unspecified  . Myocardial infarction ([facility]) 2007  . Rosacea   . Seborrhea   . Venous disease    Past Surgical History:  Procedure Laterality Date  . ANGIOPLASTY / STENTING FEMORAL    . APPENDECTOMY    . CARDIAC CATHETERIZATION  2007  . CARDIAC SURGERY  2007  . CATARACT EXTRACTION    . CORONARY ANGIOPLASTY  2007  . DIAPHRAGMATIC HERNIA REPAIR    .  HERNIA REPAIR     EMERGENT 7/15  . ORIF GREATER TROCHANTERIC FRACTURE Left 06/2009  . SKIN BIOPSY    . TREATMENT HIP DISLOCATION W/ACETABULAR WALL/FEMORAL HEAD FRACTURE OPEN Right     Allergies  Allergen Reactions  . Pioglitazone Swelling  . Ramipril Shortness Of Breath    Other reaction(s): Dizziness  . Tramadol Shortness Of Breath    Other reaction(s): Dizziness  . Atorvastatin Other (See Comments)    Other reaction(s): Unknown Joint pain   . Codeine Nausea Only  . Demerol [Meperidine] Nausea Only  . Metformin      Abdominal Pain  . Metformin And Related Other (See Comments)  . Mevacor [Lovastatin] Other (See Comments)    Allergies as of 06/23/2017      Reactions   Pioglitazone Swelling   Ramipril Shortness Of Breath  Other reaction(s): Dizziness   Tramadol Shortness Of Breath   Other reaction(s): Dizziness   Atorvastatin Other (See Comments)   Other reaction(s): Unknown Joint pain   Codeine Nausea Only   Demerol [meperidine] Nausea Only   Metformin     Abdominal Pain   Metformin And Related Other (See Comments)   Mevacor [lovastatin] Other (See Comments)      Medication List        Accurate as of 06/23/17  4:25 PM. Always use your most recent med list.          acetaminophen 325 MG tablet Commonly known as:  TYLENOL Take 325 mg by mouth 3 (three) times daily. pain fever greater than 101   ALPRAZolam 0.5 MG tablet Commonly known as:  XANAX Take 0.5 mg by mouth every 4 (four) hours as needed (anxiety, agitation, restlessness).   ALPRAZolam 0.25 MG tablet Commonly known as:  XANAX Take 0.25 mg by mouth 3 (three) times daily. anxiety, agitation, restlessness scheduled. OK to give PRN doses in addition to scheduled for severe sx.   carvedilol 3.125 MG tablet Commonly known as:  COREG Take 3.125 mg by mouth 2 (two) times daily.   DERMACLOUD Crea Apply 1 application topically daily. For barrier cream to bottom and  apply liberal amount topically  to  area of skin irritation prn. OK to leave at bedside.   digoxin 0.125 MG tablet Commonly known as:  LANOXIN Take 1 tablet (0.125 mg total) by mouth daily.   esomeprazole 40 MG capsule Commonly known as:  NEXIUM Take 40 mg by mouth daily.   furosemide 40 MG tablet Commonly known as:  LASIX Take 40 mg by mouth daily.   guaiFENesin-dextromethorphan 100-10 MG/5ML syrup Commonly known as:  ROBITUSSIN DM Take 10 mLs by mouth every 6 (six) hours as needed for cough.   ipratropium-albuterol 0.5-2.5 (3) MG/3ML Soln Commonly known as:  DUONEB Take 3 mLs by nebulization 3 (three) times daily.   isosorbide mononitrate 30 MG 24 hr tablet Commonly known as:  IMDUR Take 1 tablet (30 mg total) by mouth daily.   levalbuterol 0.63 MG/3ML nebulizer solution Commonly known as:  XOPENEX Take 3 mLs (0.63 mg total) by nebulization every 4 (four) hours as needed for wheezing or shortness of breath.   levothyroxine 100 MCG tablet Commonly known as:  SYNTHROID, LEVOTHROID Take 100 mcg by mouth daily.   mirtazapine 7.5 MG tablet Commonly known as:  REMERON Take 7.5 mg by mouth daily.   morphine CONCENTRATE 10 mg / 0.5 ml concentrated solution Take 5-10 mg by mouth every hour as needed for severe pain. pain, dyspnea, anxiety (0.25- mild sx; 0.5- moderate to severe sx)   morphine CONCENTRATE 10 MG/0.5ML Soln concentrated solution Take 0.25 mLs (5 mg total) by mouth every 4 (four) hours as needed for anxiety or shortness of breath (mild shortness of breath.).   nitroGLYCERIN 0.4 MG SL tablet Commonly known as:  NITROSTAT Place 0.4 mg under the tongue every 5 (five) minutes as needed for chest pain.   potassium chloride 10 MEQ tablet Commonly known as:  K-DUR Take 10 mEq by mouth daily.   spironolactone 25 MG tablet Commonly known as:  ALDACTONE Take 12.5 mg by mouth daily.   XARELTO 20 MG Tabs tablet Generic drug:  rivaroxaban Take 20 mg by mouth daily.   XIIDRA 5 % Soln Generic drug:   Lifitegrast Place 1 drop into both eyes 2 (two) times daily.       Review  of Systems  Constitutional: Positive for fatigue. Negative for activity change, appetite change, chills, diaphoresis and fever.  HENT: Negative for congestion, mouth sores, nosebleeds, postnasal drip, sneezing, sore throat, trouble swallowing and voice change.   Respiratory: Negative for apnea, cough, choking, chest tightness, shortness of breath (Negativenat time of assessment) and wheezing.   Cardiovascular: Negative for chest pain, palpitations and leg swelling.  Gastrointestinal: Negative for abdominal distention, abdominal pain, constipation, diarrhea and nausea.  Genitourinary: Negative for difficulty urinating, dysuria, frequency and urgency.  Musculoskeletal: Negative for back pain, gait problem and myalgias. Arthralgias: typical arthritis.  Skin: Negative for color change, pallor, rash and wound.  Neurological: Positive for weakness. Negative for dizziness, tremors, syncope, speech difficulty, numbness and headaches.  Psychiatric/Behavioral: Negative for agitation and behavioral problems.  All other systems reviewed and are negative.   Immunization History  Administered Date(s) Administered  . Influenza Split 01/02/2014, 01/19/2015  . Influenza, High Dose Seasonal PF 01/03/2016  . Influenza-Unspecified 12/26/2016  . Pneumococcal Conjugate-13 04/20/2015, 05/05/2015  . Pneumococcal Polysaccharide-23 12/13/2011  . Tdap 04/05/2007  . Zoster 12/12/2007   Pertinent  Health Maintenance Due  Topic Date Due  . FOOT EXAM  08/29/1930  . OPHTHALMOLOGY EXAM  08/29/1930  . URINE MICROALBUMIN  08/29/1930  . HEMOGLOBIN A1C  12/11/2017  . INFLUENZA VACCINE  Completed  . PNA vac Low Risk Adult  Completed   No flowsheet data found. Functional Status Survey:    Vitals:   06/23/17 1609  BP: (!) 124/108  Pulse: 66  Resp: 18  Temp: 98 F (36.7 C)  TempSrc: Oral  SpO2: 94%  Weight: 160 lb 6.4 oz (72.8 kg)    Height: 6' (1.829 m)   Body mass index is 21.75 kg/m. Physical Exam  Constitutional: He is oriented to person, place, and time. Vital signs are normal. He appears well-developed and well-nourished. He is active and cooperative. He does not appear ill. No distress. Nasal cannula in place.  HENT:  Head: Normocephalic and atraumatic.  Mouth/Throat: Uvula is midline, oropharynx is clear and moist and mucous membranes are normal. Mucous membranes are not pale, not dry and not cyanotic.  Eyes: Pupils are equal, round, and reactive to light. Conjunctivae, EOM and lids are normal.  Neck: Trachea normal, normal range of motion and full passive range of motion without pain. Neck supple. No JVD present. No tracheal deviation, no edema and no erythema present. No thyromegaly present.  Cardiovascular: Normal rate, normal heart sounds, intact distal pulses and normal pulses. An irregular rhythm present. Exam reveals no gallop, no distant heart sounds and no friction rub.  No murmur heard. Pulses:      Dorsalis pedis pulses are 2+ on the right side, and 2+ on the left side.  No edema  Pulmonary/Chest: Effort normal. No accessory muscle usage. No respiratory distress. He has decreased breath sounds in the right lower field and the left lower field. He has no wheezes. He has no rhonchi. He has no rales. He exhibits no tenderness.  Abdominal: Soft. Normal appearance and bowel sounds are normal. He exhibits no distension and no ascites. There is no tenderness.  Musculoskeletal: Normal range of motion. He exhibits no edema or tenderness.  Expected osteoarthritis, stiffness; Bilateral Calves soft, supple. Negative Homan's Sign. B- pedal pulses equal; generalized weakness  Neurological: He is alert and oriented to person, place, and time. He has normal strength. Coordination and gait abnormal.  Skin: Skin is warm, dry and intact. He is not diaphoretic. No cyanosis. No  pallor. Nails show no clubbing.  Psychiatric:  He has a normal mood and affect. His speech is normal and behavior is normal. Judgment and thought content normal. Cognition and memory are normal.  Nursing note and vitals reviewed.   Labs reviewed: Recent Labs    06/11/17 0336 06/14/17 1041 06/15/17 0544  NA 137 135 140  K 3.6 4.4 4.8  CL 105 103 104  CO2 22 23 25   GLUCOSE 350* 168* 162*  BUN 22* 27* 24*  CREATININE 0.94 0.85 0.71  CALCIUM 8.3* 8.3* 8.7*   Recent Labs    06/09/17 1020  AST 29  ALT 18  ALKPHOS 60  BILITOT 1.1  PROT 7.7  ALBUMIN 3.4*   Recent Labs    06/09/17 1020 06/12/17 0510  WBC 5.1 9.7  NEUTROABS 3.8  --   HGB 14.8 14.2  HCT 44.5 41.9  MCV 96.0 94.5  PLT 185 169   Lab Results  Component Value Date   TSH 0.067 (L) 06/12/2017   Lab Results  Component Value Date   HGBA1C 7.8 (H) 06/10/2017   Lab Results  Component Value Date   CHOL 150 04/14/2016   HDL 28 (L) 04/14/2016   LDLCALC 95 04/14/2016   TRIG 137 04/14/2016   CHOLHDL 5.4 04/14/2016    Significant Diagnostic Results in last 30 days:  Dg Chest 1 View  Result Date: 06/10/2017 CLINICAL DATA:  Pneumonia, shortness of breath, cough EXAM: CHEST 1 VIEW COMPARISON:  06/09/2017 FINDINGS: Elevation of the right hemidiaphragm. Cardiomegaly. Diffuse interstitial prominence throughout the lungs appears worsened and may reflect interstitial edema. Bilateral lower lobe airspace opacities, slightly increased on the left since prior study, stable on the right. IMPRESSION: Cardiomegaly with diffuse interstitial prominence, likely interstitial edema. Stable elevation of the right hemidiaphragm. Bibasilar opacities, worsening on the left since prior study concerning for pneumonia. Electronically Signed   By: Rolm Baptise M.D.   On: 06/10/2017 10:24   Ct Angio Chest Pe W Or Wo Contrast  Addendum Date: 06/15/2017   ADDENDUM REPORT: 06/15/2017 09:45 ADDENDUM: Comparing toe 08/24/2015 study, the opacities in the lungs are similar but progressed. More  confluent opacity in the right upper lobe now present. Differential considerations are broad including infectious or inflammatory processes. This conceivably could represent progression of chronic interstitial lung disease. Electronically Signed   By: Rolm Baptise M.D.   On: 06/15/2017 09:45   Result Date: 06/15/2017 CLINICAL DATA:  Shortness of Breath EXAM: CT ANGIOGRAPHY CHEST WITH CONTRAST TECHNIQUE: Multidetector CT imaging of the chest was performed using the standard protocol during bolus administration of intravenous contrast. Multiplanar CT image reconstructions and MIPs were obtained to evaluate the vascular anatomy. CONTRAST:  87mL ISOVUE-370 IOPAMIDOL (ISOVUE-370) INJECTION 76% COMPARISON:  08/24/2015 FINDINGS: Cardiovascular: No filling defects in the pulmonary arteries to suggest pulmonary emboli. Cardiomegaly. Diffuse coronary artery calcifications. Moderate aortic calcifications. No evidence of aortic aneurysm. Mediastinum/Nodes: Mildly prominent mediastinal lymph nodes. Pretracheal lymph node has a short axis diameter of 11 mm. Subcarinal lymph node has a short axis diameter of 15 mm. AP window lymph node has a short axis diameter of 12 mm. No hilar or axillary adenopathy. Lungs/Pleura: Small right pleural effusion. Patchy airspace disease throughout the lungs bilaterally, right greater than left, favor multifocal pneumonia. Calcified pleural plaques noted bilaterally within the upper hemithoraces. Upper Abdomen: Elevation of the right hemidiaphragm. No acute findings. Musculoskeletal: Chest wall soft tissues are unremarkable. No acute bony abnormality. Review of the MIP images confirms the above findings. IMPRESSION:  No evidence of pulmonary embolus. Cardiomegaly, severe/diffuse coronary artery disease. Patchy bilateral ground-glass airspace opacities, right greater than left concerning for multifocal pneumonia. Elevation of the right hemidiaphragm with right base atelectasis. Mildly enlarged  mediastinal lymph nodes, stable since 2017. Aortic Atherosclerosis (ICD10-I70.0). Electronically Signed: By: Rolm Baptise M.D. On: 06/14/2017 13:47   Dg Chest Port 1 View  Result Date: 06/11/2017 CLINICAL DATA:  Cough, persistent. eval for possible aspiration. EXAM: PORTABLE CHEST 1 VIEW COMPARISON:  06/10/2017 FINDINGS: There is persistent lung base opacity bilaterally, consistent with atelectasis, pneumonia or a combination. Mild cardiomegaly and bilateral interstitial thickening are noted similar to the previous day's study. No visualized pleural effusion. No pneumothorax. IMPRESSION: 1. No significant change from the previous day's study. Persistent lung base opacities that may reflect atelectasis, pneumonia or a combination of both. 2. Mild cardiomegaly and bilateral interstitial thickening. Mild congestive heart failure is suspected. Electronically Signed   By: Lajean Manes M.D.   On: 06/11/2017 18:51   Dg Chest Port 1 View  Result Date: 06/09/2017 CLINICAL DATA:  Pt having flu like symptoms for 7 days. Hx of cancer, CHF, diabetes, hypertension. Former smoker EXAM: PORTABLE CHEST 1 VIEW COMPARISON:  Chest x-rays dated 04/13/2016 and 08/24/2015 FINDINGS: Ill-defined opacities overlying the elevated right hemidiaphragm, highly suspicious for pneumonia. Diffuse bilateral interstitial prominence, similar to previous exam, suspected mild chronic versus recurrent interstitial edema. Heart size and mediastinal contours are stable. Atherosclerotic changes again noted at the aortic arch. No pneumothorax or large pleural effusion seen. No acute or suspicious osseous finding. IMPRESSION: 1. New ill-defined opacities at the right lung base, highly suspicious for pneumonia. 2. Bilateral interstitial prominence, presumably interstitial edema, similar to previous exams suggesting mild chronic versus recurrent CHF/volume overload. 3. Aortic atherosclerosis. Electronically Signed   By: Franki Cabot M.D.   On:  06/09/2017 10:45    Assessment/Plan  Hypertensive heart disease with heart failure (HCC)  Other form of dyspnea  Encounter for dying care   Updated Hospice nurse and staff nurse about the conversation  Schedule Alprazolam 0.25 mg po TID  Schedule Roxanol 0.25 mL po Q 4 hours  Continue PRN Roxanol 0.25-0.5 mL PO Q 1 hour prn- pain, dyspnea  Continue scheduled Duonebs TID  Family/ staff Communication:   Total Time: 45 minutes  Documentation: 10 minutes  Face to Face: 35 minutes  Family/Phone: 35 minutes (at bedside)   Labs/tests ordered:    Medication list reviewed and assessed for continued appropriateness. Monthly medication orders reviewed and signed.  Vikki Ports, NP-C Geriatrics [facility] [phone], [place] [address] Cell Phone (Mon-Fri 8am-5pm):  [phone] On Call:  [phone] & follow prompts after 5pm & weekends Office Phone:  [phone] Office Fax:  [phone]

## 2017-07-03 ENCOUNTER — Encounter: Admission: RE | Admit: 2017-07-03 | Payer: Medicare Other | Source: Ambulatory Visit | Admitting: Internal Medicine

## 2017-07-03 DEATH — deceased

## 2018-02-28 IMAGING — DX DG CHEST 1V
1 series · 1 of 1 positions shown · non-contrast
Comparison: CT chest 08/24/2015.  Chest 08/24/2015.

CLINICAL DATA: Chest pain for 1 hour.

EXAM:
CHEST 1 VIEW

[chest ap]
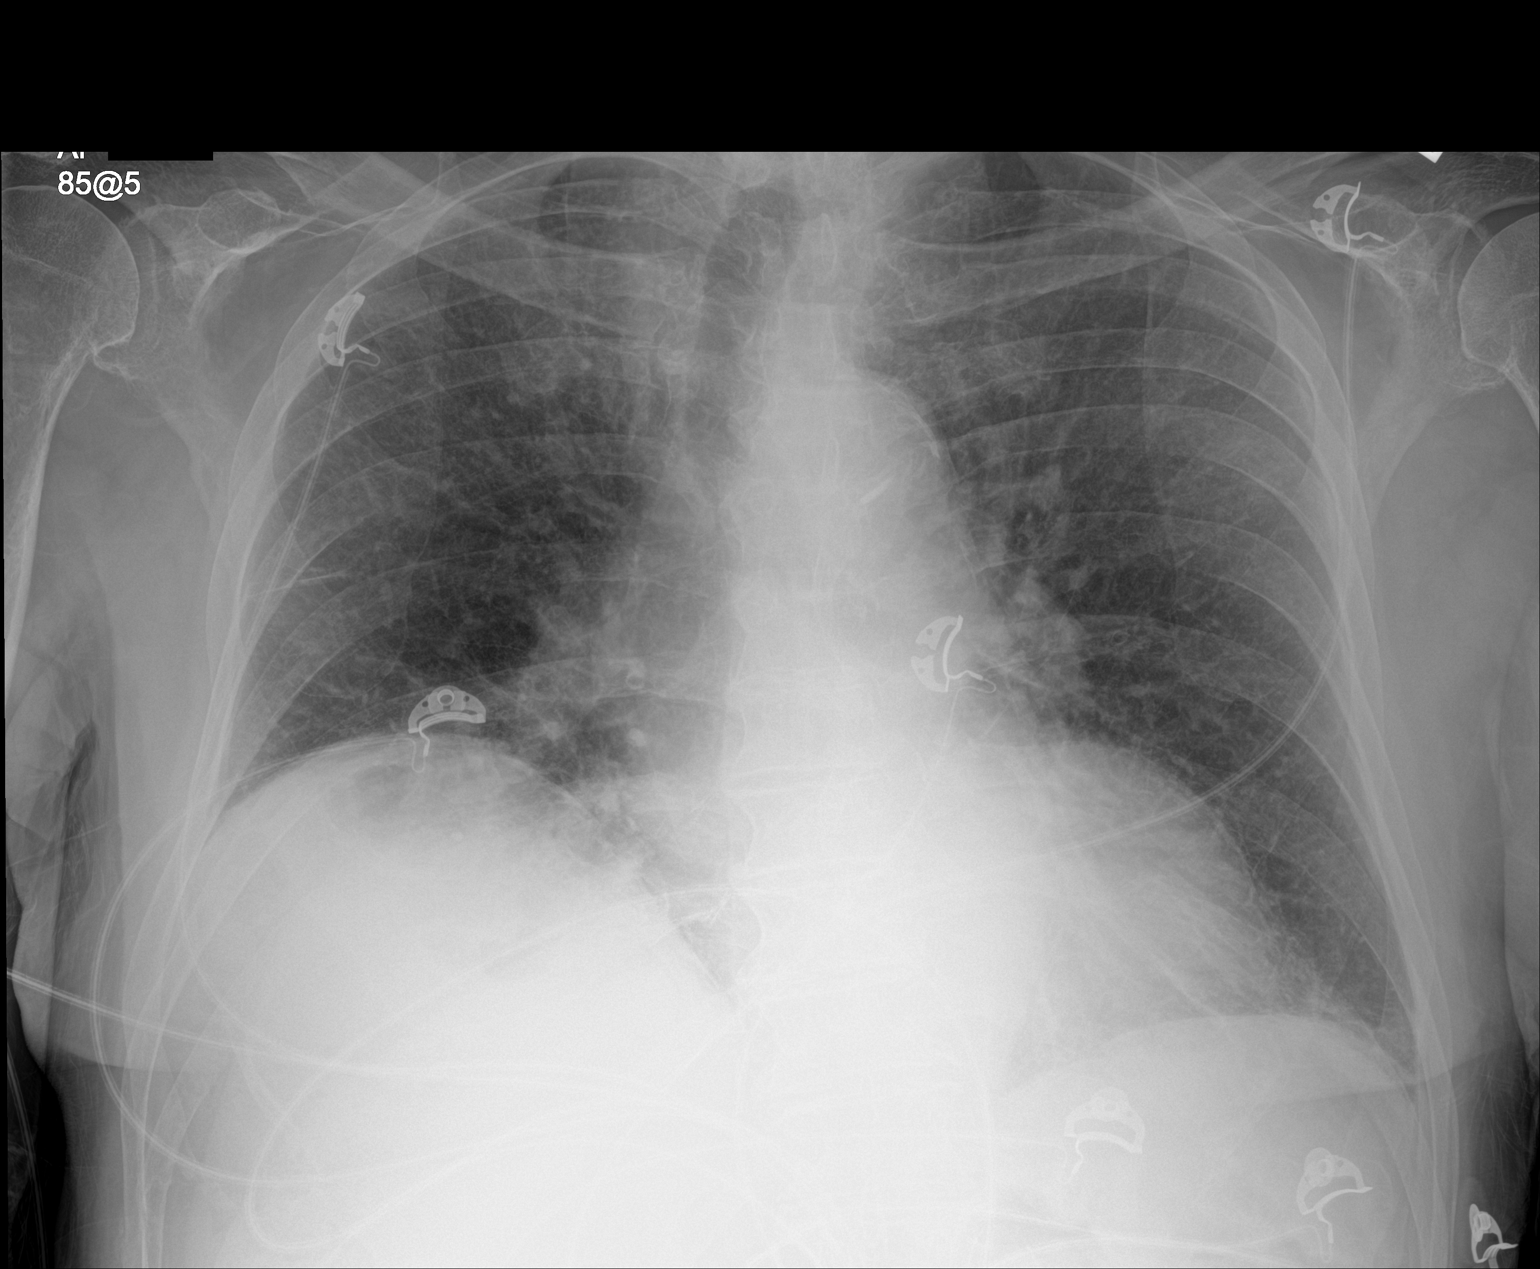

[1 of 1 positions shown; findings below may reference images not displayed]

FINDINGS: Shallow inspiration with elevation of the right hemidiaphragm. Mild
cardiac enlargement without vascular congestion or edema. Fibrosis
or linear atelectasis in the lung bases. No focal consolidation. No
blunting of costophrenic angles. No pneumothorax. Calcified and
tortuous aorta.
IMPRESSION: Elevation of the right hemidiaphragm. Linear atelectasis or fibrosis
in the lung bases. Cardiac enlargement. No evidence of active
consolidation.

## 2019-04-26 IMAGING — DX DG CHEST 1V PORT
1 series · 1 of 1 positions shown · non-contrast
Comparison: Chest x-rays dated 04/13/2016 and 08/24/2015

CLINICAL DATA: Pt having flu like symptoms for 7 days. Hx of
cancer, CHF, diabetes, hypertension. Former smoker

EXAM:
PORTABLE CHEST 1 VIEW

[chest ap]
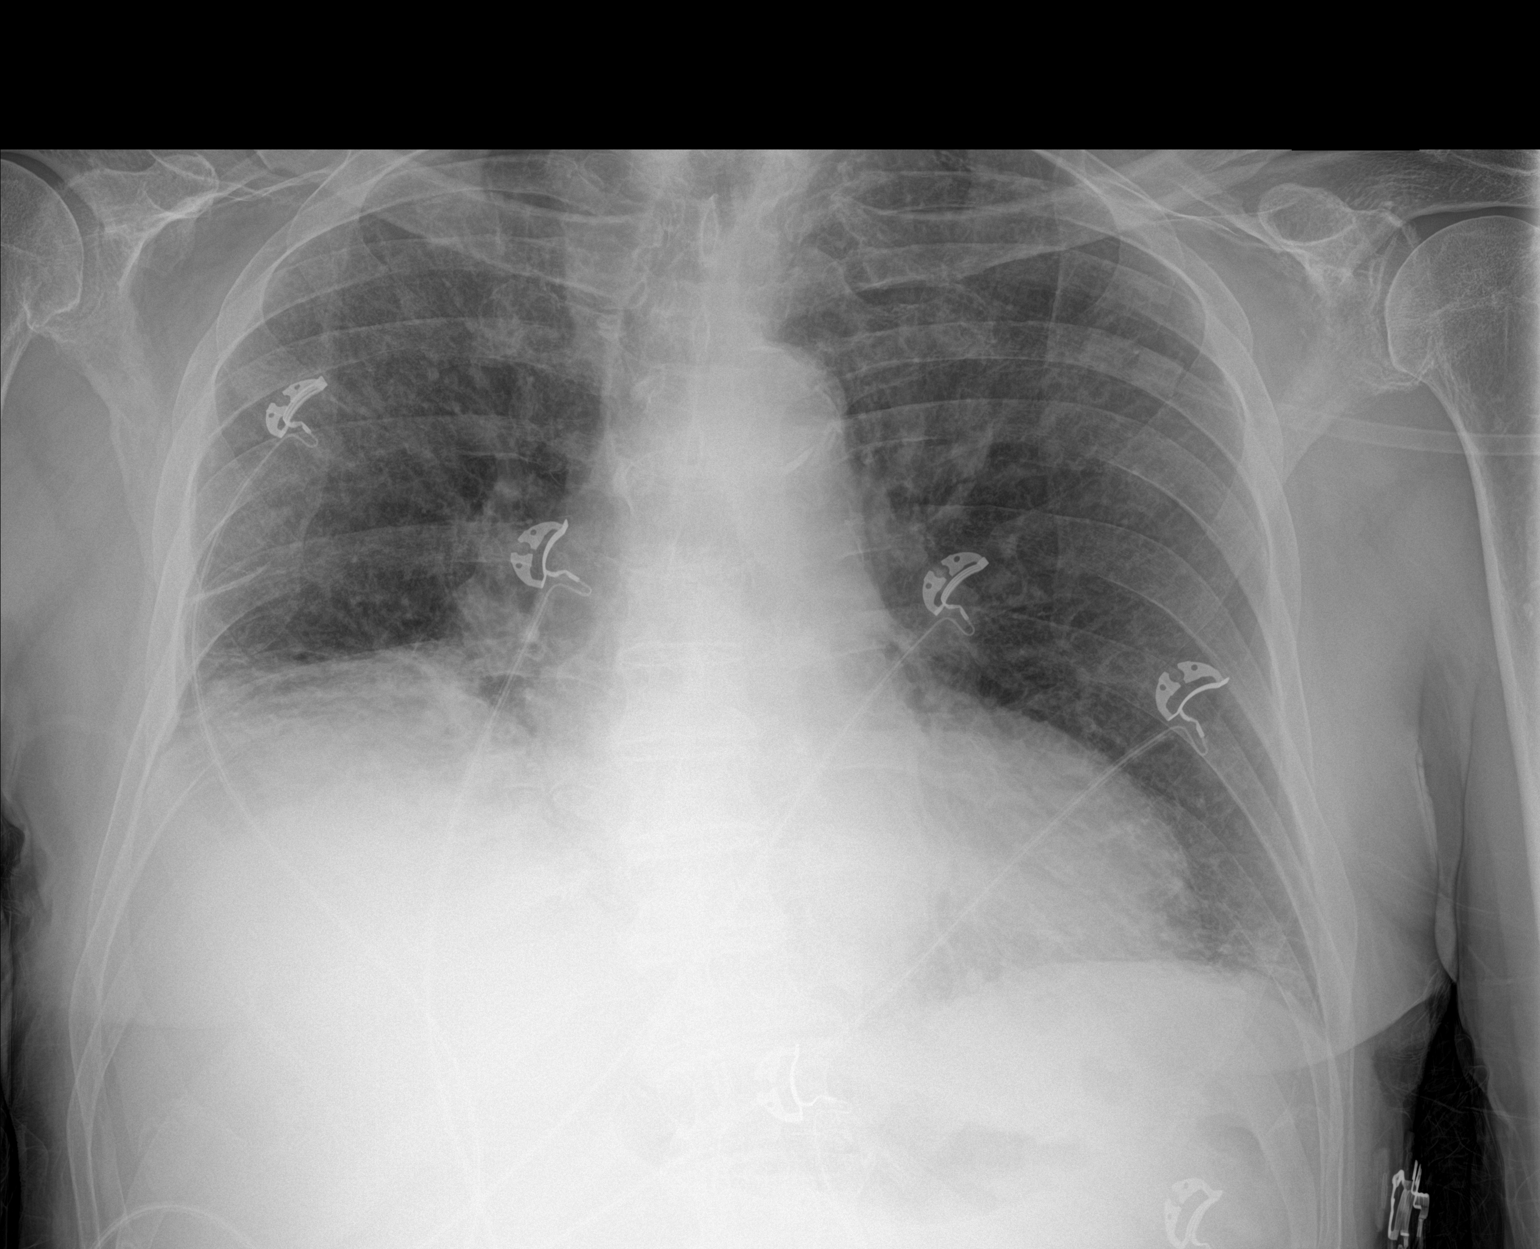

[1 of 1 positions shown; findings below may reference images not displayed]

FINDINGS: Ill-defined opacities overlying the elevated right hemidiaphragm,
highly suspicious for pneumonia. Diffuse bilateral interstitial
prominence, similar to previous exam, suspected mild chronic versus
recurrent interstitial edema. Heart size and mediastinal contours
are stable. Atherosclerotic changes again noted at the aortic arch.
No pneumothorax or large pleural effusion seen. No acute or
suspicious osseous finding.
IMPRESSION: 1. New ill-defined opacities at the right lung base, highly
suspicious for pneumonia.
2. Bilateral interstitial prominence, presumably interstitial edema,
similar to previous exams suggesting mild chronic versus recurrent
CHF/volume overload.
3. Aortic atherosclerosis.
